# Patient Record
Sex: Male | Born: 1970 | Race: Black or African American | Hispanic: No | Marital: Married | Smoking: Never smoker
Health system: Southern US, Community
[De-identification: ages and names within clinical notes are randomized; demographics above are authoritative.]

## PROBLEM LIST (undated history)

## (undated) DIAGNOSIS — E049 Nontoxic goiter, unspecified: Secondary | ICD-10-CM

## (undated) DIAGNOSIS — M25562 Pain in left knee: Secondary | ICD-10-CM

## (undated) DIAGNOSIS — R7309 Other abnormal glucose: Secondary | ICD-10-CM

## (undated) DIAGNOSIS — E785 Hyperlipidemia, unspecified: Secondary | ICD-10-CM

## (undated) DIAGNOSIS — E119 Type 2 diabetes mellitus without complications: Secondary | ICD-10-CM

## (undated) DIAGNOSIS — M542 Cervicalgia: Secondary | ICD-10-CM

## (undated) DIAGNOSIS — K648 Other hemorrhoids: Secondary | ICD-10-CM

## (undated) DIAGNOSIS — E8881 Metabolic syndrome: Secondary | ICD-10-CM

## (undated) DIAGNOSIS — R6882 Decreased libido: Secondary | ICD-10-CM

## (undated) DIAGNOSIS — M1990 Unspecified osteoarthritis, unspecified site: Secondary | ICD-10-CM

## (undated) DIAGNOSIS — E669 Obesity, unspecified: Secondary | ICD-10-CM

## (undated) DIAGNOSIS — N529 Male erectile dysfunction, unspecified: Secondary | ICD-10-CM

## (undated) DIAGNOSIS — E291 Testicular hypofunction: Secondary | ICD-10-CM

## (undated) DIAGNOSIS — K219 Gastro-esophageal reflux disease without esophagitis: Secondary | ICD-10-CM

## (undated) HISTORY — DX: Hyperlipidemia, unspecified: E78.5

## (undated) HISTORY — DX: Cervicalgia: M54.2

## (undated) HISTORY — DX: Decreased libido: R68.82

## (undated) HISTORY — DX: Gastro-esophageal reflux disease without esophagitis: K21.9

## (undated) HISTORY — PX: FRACTURE SURGERY: SHX138

## (undated) HISTORY — DX: Male erectile dysfunction, unspecified: N52.9

## (undated) HISTORY — DX: Type 2 diabetes mellitus without complications: E11.9

## (undated) HISTORY — DX: Metabolic syndrome: E88.810

## (undated) HISTORY — DX: Nontoxic goiter, unspecified: E04.9

## (undated) HISTORY — DX: Obesity, unspecified: E66.9

## (undated) HISTORY — PX: ACHILLES TENDON SURGERY: SHX542

## (undated) HISTORY — DX: Unspecified osteoarthritis, unspecified site: M19.90

## (undated) HISTORY — DX: Other hemorrhoids: K64.8

## (undated) HISTORY — DX: Metabolic syndrome: E88.81

## (undated) HISTORY — DX: Pain in left knee: M25.562

## (undated) HISTORY — DX: Testicular hypofunction: E29.1

## (undated) HISTORY — DX: Other abnormal glucose: R73.09

## (undated) HISTORY — PX: COLONOSCOPY: SHX174

---

## 2006-11-17 ENCOUNTER — Ambulatory Visit: Payer: Self-pay | Admitting: Family Medicine

## 2006-11-17 DIAGNOSIS — M542 Cervicalgia: Secondary | ICD-10-CM | POA: Insufficient documentation

## 2008-05-31 ENCOUNTER — Ambulatory Visit: Payer: Self-pay | Admitting: Orthopedic Surgery

## 2008-06-03 ENCOUNTER — Ambulatory Visit: Payer: Self-pay | Admitting: Orthopedic Surgery

## 2008-08-19 ENCOUNTER — Encounter: Admission: RE | Admit: 2008-08-19 | Discharge: 2008-09-10 | Payer: Self-pay | Admitting: Orthopedic Surgery

## 2008-11-18 IMAGING — CR CERVICAL SPINE - COMPLETE 4+ VIEW
1 series · 5 of 5 positions shown · non-contrast
Comparison: none

REASON FOR EXAM: cervical pain
COMMENTS:

[Series 1: view not recorded · 0.17mm/px · 5 of 5 slices shown]
[im 1/5]
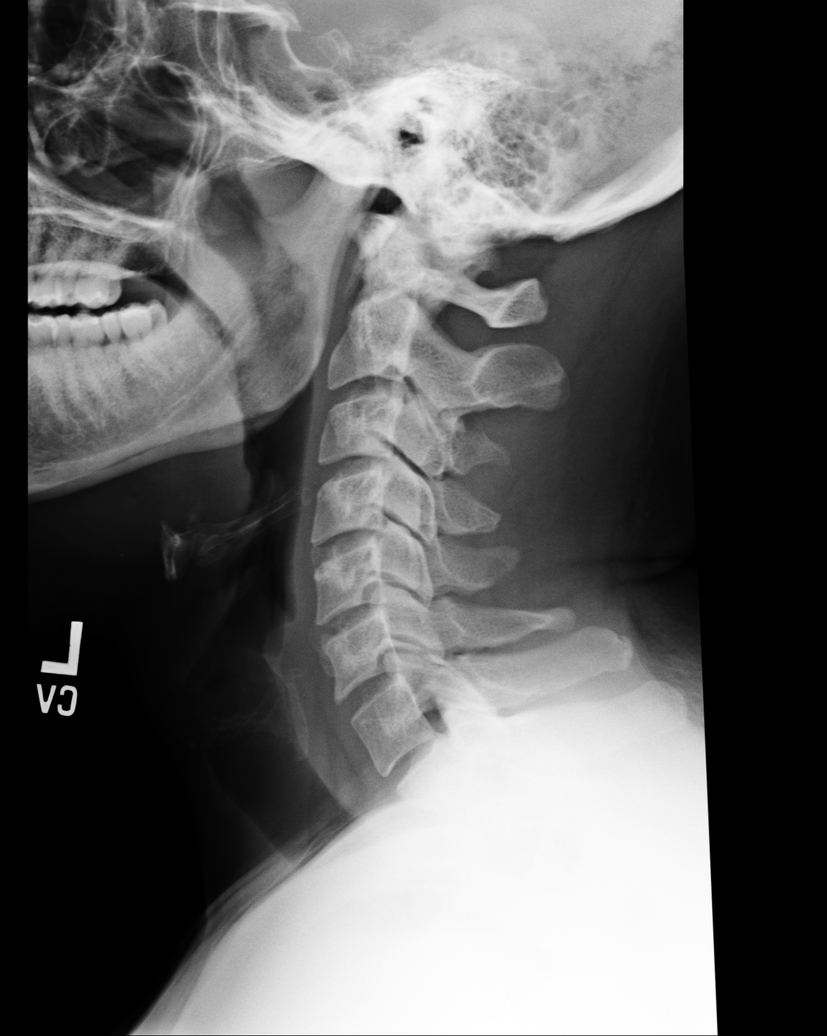
[im 2/5]
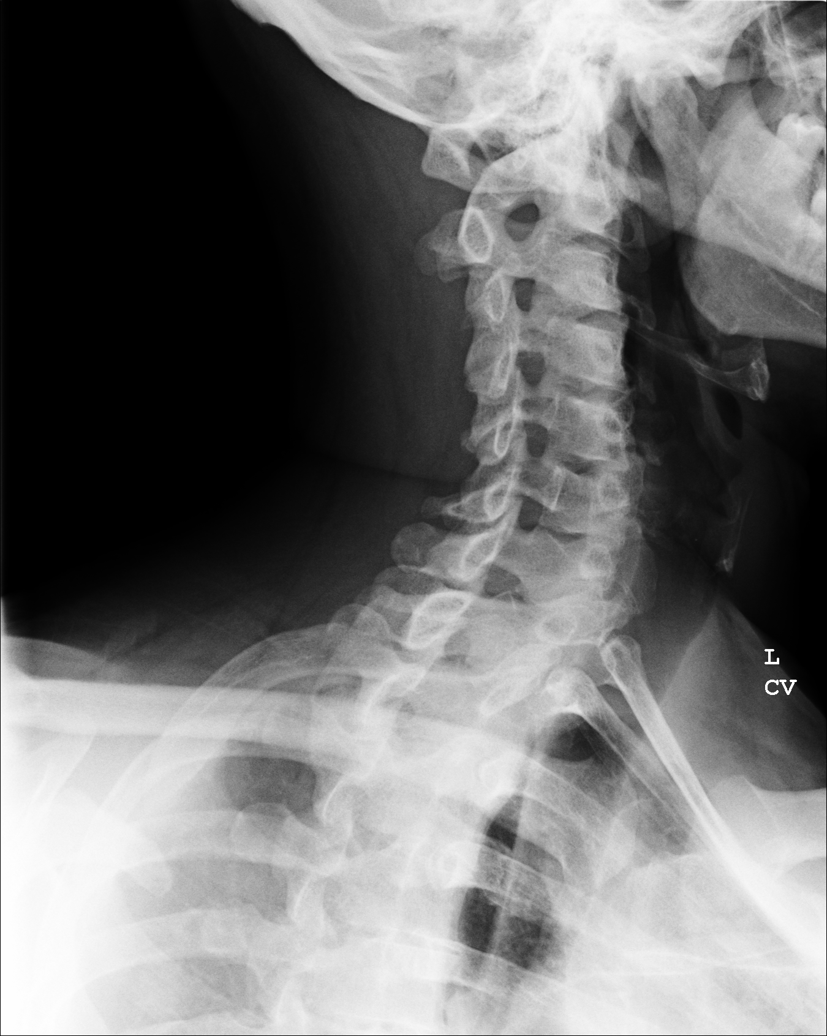
[im 3/5]
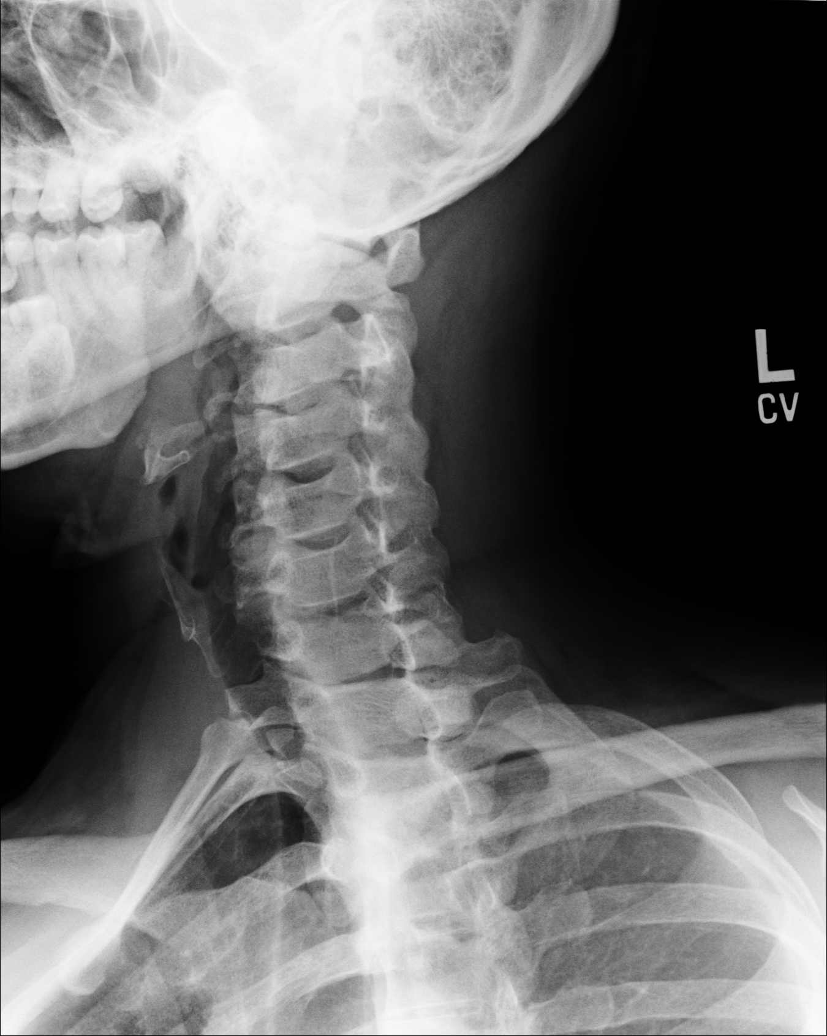
[im 4/5]
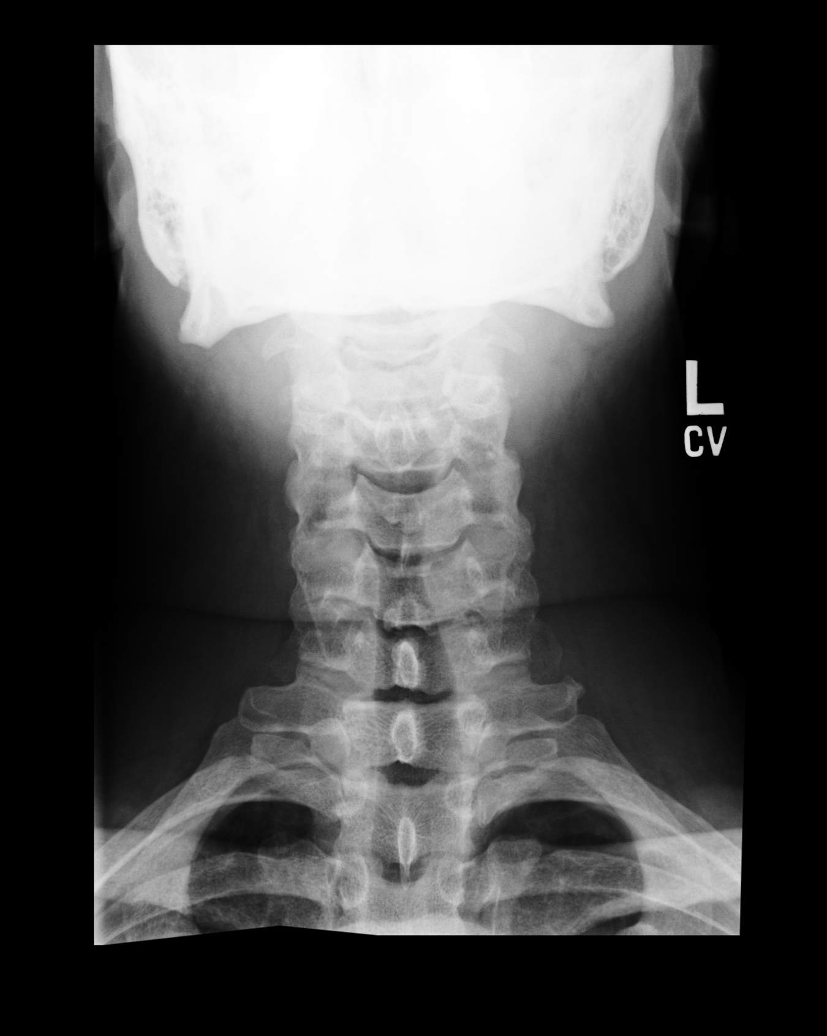
[im 5/5]
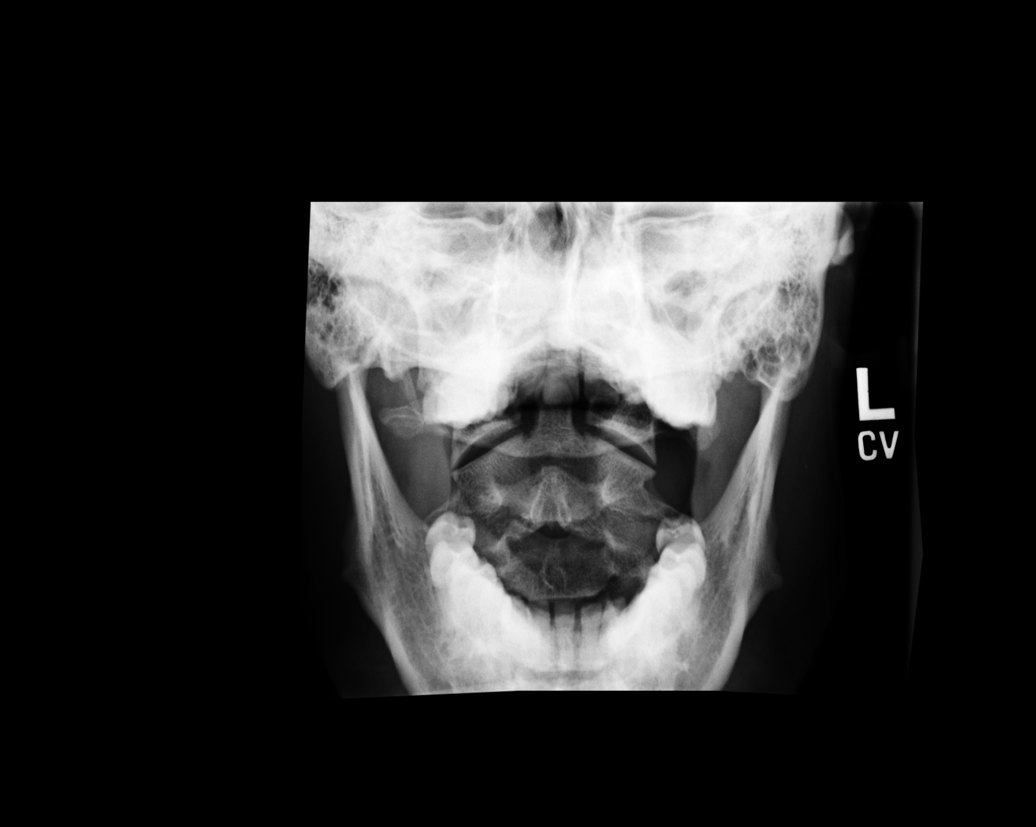

[5 of 5 positions shown; findings below may reference images not displayed]

PROCEDURE:     DXR - DXR CERVICAL SPINE COMPLETE  - November 17, 2006 [DATE]

RESULT:     The vertebral body heights and the intervertebral disc spaces
are well maintained. The vertebral body alignment is normal. Oblique view
shows the neural foramina to be widely patent bilaterally. The odontoid
process is intact.
IMPRESSION: No significant abnormalities are noted.

## 2008-11-25 DIAGNOSIS — R7309 Other abnormal glucose: Secondary | ICD-10-CM | POA: Insufficient documentation

## 2009-06-12 ENCOUNTER — Emergency Department: Payer: Self-pay | Admitting: Unknown Physician Specialty

## 2010-01-14 ENCOUNTER — Ambulatory Visit: Payer: Self-pay | Admitting: Unknown Physician Specialty

## 2010-01-15 LAB — PATHOLOGY REPORT

## 2014-02-21 ENCOUNTER — Ambulatory Visit: Payer: Self-pay | Admitting: Family Medicine

## 2014-02-21 LAB — PSA: PSA: NORMAL

## 2014-02-21 LAB — HEMOGLOBIN A1C: Hgb A1c MFr Bld: 6.6 % — AB (ref 4.0–6.0)

## 2014-02-21 LAB — LIPID PANEL
CHOLESTEROL: 154 mg/dL (ref 0–200)
HDL: 31 mg/dL — AB (ref 35–70)
LDL Cholesterol: 107 mg/dL
TRIGLYCERIDES: 78 mg/dL (ref 40–160)

## 2014-03-11 ENCOUNTER — Ambulatory Visit: Payer: Self-pay | Admitting: Gastroenterology

## 2014-03-11 LAB — HM COLONOSCOPY

## 2014-09-24 ENCOUNTER — Encounter: Payer: Self-pay | Admitting: Family Medicine

## 2014-09-24 DIAGNOSIS — M1712 Unilateral primary osteoarthritis, left knee: Secondary | ICD-10-CM | POA: Insufficient documentation

## 2014-09-24 DIAGNOSIS — E8881 Metabolic syndrome: Secondary | ICD-10-CM | POA: Insufficient documentation

## 2014-09-24 DIAGNOSIS — R6882 Decreased libido: Secondary | ICD-10-CM | POA: Insufficient documentation

## 2014-09-24 DIAGNOSIS — N529 Male erectile dysfunction, unspecified: Secondary | ICD-10-CM | POA: Insufficient documentation

## 2014-09-24 DIAGNOSIS — E785 Hyperlipidemia, unspecified: Secondary | ICD-10-CM | POA: Insufficient documentation

## 2014-09-24 DIAGNOSIS — E049 Nontoxic goiter, unspecified: Secondary | ICD-10-CM | POA: Insufficient documentation

## 2014-09-24 DIAGNOSIS — K648 Other hemorrhoids: Secondary | ICD-10-CM | POA: Insufficient documentation

## 2014-09-24 DIAGNOSIS — E669 Obesity, unspecified: Secondary | ICD-10-CM | POA: Insufficient documentation

## 2014-09-27 ENCOUNTER — Encounter: Payer: Self-pay | Admitting: Family Medicine

## 2014-09-27 ENCOUNTER — Ambulatory Visit (INDEPENDENT_AMBULATORY_CARE_PROVIDER_SITE_OTHER): Payer: BLUE CROSS/BLUE SHIELD | Admitting: Family Medicine

## 2014-09-27 VITALS — BP 122/82 | HR 77 | Temp 97.8°F | Resp 16 | Ht 74.0 in | Wt 225.3 lb

## 2014-09-27 DIAGNOSIS — M5412 Radiculopathy, cervical region: Secondary | ICD-10-CM | POA: Diagnosis not present

## 2014-09-27 DIAGNOSIS — E8881 Metabolic syndrome: Secondary | ICD-10-CM | POA: Diagnosis not present

## 2014-09-27 DIAGNOSIS — E049 Nontoxic goiter, unspecified: Secondary | ICD-10-CM | POA: Diagnosis not present

## 2014-09-27 DIAGNOSIS — M25561 Pain in right knee: Secondary | ICD-10-CM | POA: Diagnosis not present

## 2014-09-27 DIAGNOSIS — R7309 Other abnormal glucose: Secondary | ICD-10-CM

## 2014-09-27 DIAGNOSIS — E785 Hyperlipidemia, unspecified: Secondary | ICD-10-CM

## 2014-09-27 DIAGNOSIS — Z23 Encounter for immunization: Secondary | ICD-10-CM

## 2014-09-27 DIAGNOSIS — E663 Overweight: Secondary | ICD-10-CM | POA: Diagnosis not present

## 2014-09-27 DIAGNOSIS — M25562 Pain in left knee: Secondary | ICD-10-CM | POA: Insufficient documentation

## 2014-09-27 NOTE — Patient Instructions (Signed)
Lipid panel shows low HDL : to improve HDL patient  needs to eat tree nuts ( pecans/pistachios/almonds ) four times weekly, eat fish two times weekly  and exercise  at least 150 minutes per week 

## 2014-09-27 NOTE — Progress Notes (Signed)
Name: Jeffrey Schneider   MRN: [id number]    DOB: 1970-03-15   Date:09/27/2014       Progress Note  Subjective  Chief Complaint  Chief Complaint  Patient presents with  . Follow-up    6 month  . Numbness    bilateral hand and pain    HPI  Metabolic Syndrome: in Feb 2016 his hgbA1C was 6.6, but he has changed his diet and has lost a lot of weight, his last hgbA1C done at work on Jun 08 2014 and it was down 6.1%. He is doing excellent at this time.   Goiter: slightly enlarged on [place] but no cysts.   Cervical pain with intermittent radiculitis: he states he has intermittent and occasionally he has numbness and sometimes right shoulder pain.  Symptoms lasts for hours and resolves by itself or with Naproxen prn . He is no sure what part of his hand is numb.   Overweight: his weight has gone down from 281.19lbs in January 2016 to 225.3 lbs. He is doing excellent with life style modifications, he has been exercising between 5-6 days weekly, avoiding sodas, drinking water and is feeling well  Dyslipidemia; his HDL was low, but now he is exercising , eats fish a couple of times weekly and would like to have labs rechecked.   Patient Active Problem List   Diagnosis Date Noted  . Bilateral knee pain 09/27/2014  . Dyslipidemia 09/24/2014  . Decreased libido 09/24/2014  . Failure of erection 09/24/2014  . Goiter 09/24/2014  . Hemorrhoids, internal 09/24/2014  . Dysmetabolic syndrome 09/24/2014  . Overweight (BMI 25.0-29.9) 09/24/2014  . Osteoarthritis of left knee 09/24/2014  . Abnormal blood sugar 11/25/2008  . Cervical pain 11/17/2006    Past Surgical History  Procedure Laterality Date  . Achilles tendon surgery Left     Past Rupture    Family History  Problem Relation Age of Onset  . Depression Mother     Social History   Social History  . Marital Status: Married    Spouse Name: N/A  . Number of Children: N/A  . Years of Education: N/A   Occupational History  . Not on  file.   Social History Main Topics  . Smoking status: Never Smoker   . Smokeless tobacco: Never Used  . Alcohol Use: 0.0 oz/week    0 Standard drinks or equivalent per week     Comment: socially  . Drug Use: No  . Sexual Activity: Yes   Other Topics Concern  . Not on file   Social History Narrative    No current outpatient prescriptions on file.  No Known Allergies   ROS  Constitutional: Negative for fever, positive for  weight change.  Respiratory: Negative for cough and shortness of breath.   Cardiovascular: Negative for chest pain or palpitations.  Gastrointestinal: Negative for abdominal pain, no bowel changes.  Musculoskeletal: Negative for gait problem or joint swelling. He has grinding and intermittent knee pain  Skin: Negative for rash.  Neurological: Negative for dizziness or headache.  No other specific complaints in a complete review of systems (except as listed in HPI above).  Objective  Filed Vitals:   09/27/14 0827  BP: 122/82  Pulse: 77  Temp: 97.8 F (36.6 C)  TempSrc: Oral  Resp: 16  Height: 6\' 2"  (1.88 m)  Weight: 225 lb 4.8 oz (102.195 kg)  SpO2: 96%    Body mass index is 28.91 kg/(m^2).  Physical Exam  Constitutional: Patient appears  well-developed and well-nourished. Overweight  No distress.  HEENT: head atraumatic, normocephalic, pupils equal and reactive to light,neck supple, throat within normal limits Cardiovascular: Normal rate, regular rhythm and normal heart sounds.  No murmur heard. No BLE edema. Pulmonary/Chest: Effort normal and breath sounds normal. No respiratory distress. Abdominal: Soft.  There is no tenderness. Psychiatric: Patient has a normal mood and affect. behavior is normal. Judgment and thought content normal. Muscular Skeletal: grinding with extension of both knees, normal knee exam, normal shoulder exam Neuro: normal grip, normal sensation upper and lower extremity   PHQ2/9: Depression screen PHQ 2/9 09/27/2014   Decreased Interest 0  Down, Depressed, Hopeless 0  PHQ - 2 Score 0     Fall Risk: Fall Risk  09/27/2014  Falls in the past year? Yes  Number falls in past yr: 2 or more  Injury with Fall? No     Functional Status Survey: Is the patient deaf or have difficulty hearing?: No Does the patient have difficulty seeing, even when wearing glasses/contacts?: Yes (contacts/glasses) Does the patient have difficulty concentrating, remembering, or making decisions?: No Does the patient have difficulty walking or climbing stairs?: No Does the patient have difficulty dressing or bathing?: No Does the patient have difficulty doing errands alone such as visiting a doctor's office or shopping?: No    Assessment & Plan  1. Dysmetabolic syndrome Continue life style modification  - Hemoglobin A1c - Comprehensive metabolic panel  2. Needs flu shot  - Flu Vaccine QUAD 36+ mos PF IM (Fluarix & Fluzone Quad PF)- refused, he will get it at work   3. Dyslipidemia Lipid panel shows low HDL : to improve HDL patient  needs to eat tree nuts ( pecans/pistachios/almonds ) four times weekly, eat fish two times weekly  and exercise  at least 150 minutes per week  - Lipid panel  4. Abnormal blood sugar Doing well with life style modification   5. Goiter US done in [place], does not need biopsy  6. Overweight (BMI 25.0-29.9) Discussed with the patient the risk posed by an increased BMI. Discussed importance of portion control, calorie counting and at least 150 minutes of physical activity weekly. Avoid sweet beverages and drink more water. Eat at least 6 servings of fruit and vegetables daily   7. Bilateral knee pain Likely OA, advised Tylenol 500 mg three times daily prn    8. Cervical radiculitis Not at this time, he does not want any further testing at this time, advised to write down exactly where he gets numb and how long it lasts

## 2014-11-01 LAB — LIPID PANEL
Cholesterol: 159 mg/dL (ref 0–200)
HDL: 45 mg/dL (ref 35–70)
LDL Cholesterol: 97 mg/dL
TRIGLYCERIDES: 85 mg/dL (ref 40–160)

## 2014-11-01 LAB — HEMOGLOBIN A1C: HEMOGLOBIN A1C: 5.8 % (ref 4.0–6.0)

## 2015-02-18 ENCOUNTER — Ambulatory Visit (INDEPENDENT_AMBULATORY_CARE_PROVIDER_SITE_OTHER): Payer: BLUE CROSS/BLUE SHIELD | Admitting: Family Medicine

## 2015-02-18 ENCOUNTER — Encounter: Payer: Self-pay | Admitting: Family Medicine

## 2015-02-18 VITALS — BP 116/68 | HR 71 | Temp 98.3°F | Resp 16 | Ht 74.0 in | Wt 232.9 lb

## 2015-02-18 DIAGNOSIS — Z0000 Encounter for general adult medical examination without abnormal findings: Secondary | ICD-10-CM | POA: Diagnosis not present

## 2015-02-18 DIAGNOSIS — I83811 Varicose veins of right lower extremities with pain: Secondary | ICD-10-CM

## 2015-02-18 DIAGNOSIS — E8881 Metabolic syndrome: Secondary | ICD-10-CM | POA: Diagnosis not present

## 2015-02-18 DIAGNOSIS — E049 Nontoxic goiter, unspecified: Secondary | ICD-10-CM | POA: Diagnosis not present

## 2015-02-18 MED ORDER — MEDICAL COMPRESSION STOCKINGS MISC: 2.0000 [IU] | Freq: Every day | Status: DC

## 2015-02-18 MED ORDER — THERA VITAL M PO TABS: 1.0000 | ORAL_TABLET | Freq: Every day | ORAL | Status: DC

## 2015-02-18 NOTE — Progress Notes (Signed)
Name: Jeffrey Schneider   MRN: [id number]    DOB: April 01, 1970   Date:02/18/2015       Progress Note  Subjective  Chief Complaint  Chief Complaint  Patient presents with  . Annual Exam    HPI  Well male exam: he states his libido has improved, he has noticed that erection is not as hard as when he was 20 , but they still last long enough. He states not bad enough to need therapy at this time.   Goiter: had thyroid US in the past, we will check Thyroid function, he had some weight gain because he has changed his diet, not eating as many salads.   Varicose Veins: he has noticed some bulging on the right lower leg, also has pain when he stands or walks. Pain is described as soreness, and after activity it is worse, and when standing. He plays basketball twice weekly and makes his legs very sore, but right is worse than left.   Metabolic Syndrome: HDL has improved, triglycerides within normal limits, hgbA1C has improved, but he is not following a diet as much now. He denies polyphagia, polyuria or polydipsia at this time  Patient Active Problem List   Diagnosis Date Noted  . Bilateral knee pain 09/27/2014  . Dyslipidemia 09/24/2014  . Decreased libido 09/24/2014  . Failure of erection 09/24/2014  . Goiter 09/24/2014  . Hemorrhoids, internal 09/24/2014  . Dysmetabolic syndrome 09/24/2014  . Overweight (BMI 25.0-29.9) 09/24/2014  . Osteoarthritis of left knee 09/24/2014  . Abnormal blood sugar 11/25/2008  . Cervical pain 11/17/2006    Past Surgical History  Procedure Laterality Date  . Achilles tendon surgery Left     Past Rupture    Family History  Problem Relation Age of Onset  . Depression Mother   . Obesity Mother   . Alcohol abuse Father   . Cirrhosis Father   . Cirrhosis Sister   . Alcohol abuse Brother   . Allergic rhinitis Daughter   . Allergic rhinitis Son   . Cancer Maternal Grandmother     Colon    Social History   Social History  . Marital Status:  Married    Spouse Name: N/A  . Number of Children: N/A  . Years of Education: N/A   Occupational History  . Not on file.   Social History Main Topics  . Smoking status: Never Smoker   . Smokeless tobacco: Never Used  . Alcohol Use: No  . Drug Use: No  . Sexual Activity:    Partners: Female   Other Topics Concern  . Not on file   Social History Narrative     Current outpatient prescriptions:  Marland Kitchen  Multiple Vitamins-Minerals (MULTIVITAMIN) tablet, Take 1 tablet by mouth daily., Disp: 30 tablet, Rfl: 0  No Known Allergies   ROS  Constitutional: Negative for fever or weight change.  Respiratory: Negative for cough and shortness of breath.   Cardiovascular: Negative for chest pain or palpitations.  Gastrointestinal: Negative for abdominal pain, no bowel changes.  Musculoskeletal: Negative for gait problem or joint swelling.  Skin: Negative for rash.  Neurological: Negative for dizziness or headache.  No other specific complaints in a complete review of systems (except as listed in HPI above).  Objective  Filed Vitals:   02/18/15 0844  BP: 116/68  Pulse: 71  Temp: 98.3 F (36.8 C)  TempSrc: Oral  Resp: 16  Height:  (1.88 m)  Weight: 232 lb 14.4 oz (105.643 kg)  SpO2: 97%    Body mass index is 29.89 kg/(m^2).  Physical Exam  Constitutional: Patient appears well-developed and well-nourished. No distress.  HENT: Head: Normocephalic and atraumatic. Ears: B TMs ok, no erythema or effusion; Nose: Nose normal. Mouth/Throat: Oropharynx is clear and moist. No oropharyngeal exudate.  Eyes: Conjunctivae and EOM are normal. Pupils are equal, round, and reactive to light. No scleral icterus.  Neck: Normal range of motion. Neck supple. No JVD present. Tyromegaly present.  Cardiovascular: Normal rate, regular rhythm and normal heart sounds.  No murmur heard. No BLE edema. Varicose veins on right lower extremity Pulmonary/Chest: Effort normal and breath sounds normal. No  respiratory distress. Abdominal: Soft. Bowel sounds are normal, no distension. There is no tenderness. no masses MALE GENITALIA: Normal descended testes bilaterally, no masses palpated, no hernias, no lesions, no discharge RECTAL: not done - discussed USPTF and he chose not to have it done Musculoskeletal: Normal range of motion, no joint effusions. No gross deformities Neurological: he is alert and oriented to person, place, and time. No cranial nerve deficit. Coordination, balance, strength, speech and gait are normal.  Skin: Skin is warm and dry. No rash noted. No erythema.  Psychiatric: Patient has a normal mood and affect. behavior is normal. Judgment and thought content normal.  PHQ2/9: Depression screen [facility] 2/9 02/18/2015 09/27/2014  Decreased Interest 0 0  Down, Depressed, Hopeless 0 0  PHQ - 2 Score 0 0    Fall Risk: Fall Risk  02/18/2015 09/27/2014  Falls in the past year? No Yes  Number falls in past yr: - 2 or more  Injury with Fall? - No     Functional Status Survey: Is the patient deaf or have difficulty hearing?: No Does the patient have difficulty seeing, even when wearing glasses/contacts?: No Does the patient have difficulty concentrating, remembering, or making decisions?: No Does the patient have difficulty walking or climbing stairs?: No Does the patient have difficulty dressing or bathing?: No Does the patient have difficulty doing errands alone such as visiting a doctor's office or shopping?: No    Assessment & Plan  1. Encounter for routine history and physical exam for male  Discussed importance of 150 minutes of physical activity weekly, eat two servings of fish weekly, eat one serving of tree nuts ( cashews, pistachios, pecans, almonds.Marland Kitchen) every other day, eat 6 servings of fruit/vegetables daily and drink plenty of water and avoid sweet beverages.   2. Goiter  [place] mild enlargement  - Thyroid Panel With TSH  3. Varicose veins of leg with pain,  right  - Elastic Bandages & Supports (MEDICAL COMPRESSION STOCKINGS) MISC; 2 Units by Does not apply route daily.  Dispense: 2 each; Refill: 2  4. Dysmetabolic syndrome  Discussed diet and exercise and importance of losing weight again

## 2015-02-20 ENCOUNTER — Encounter: Payer: Self-pay | Admitting: Family Medicine

## 2015-08-18 ENCOUNTER — Ambulatory Visit: Payer: BLUE CROSS/BLUE SHIELD | Admitting: Family Medicine

## 2015-09-02 ENCOUNTER — Encounter: Payer: Self-pay | Admitting: Family Medicine

## 2015-09-02 ENCOUNTER — Ambulatory Visit (INDEPENDENT_AMBULATORY_CARE_PROVIDER_SITE_OTHER): Payer: BLUE CROSS/BLUE SHIELD | Admitting: Family Medicine

## 2015-09-02 VITALS — BP 130/80 | HR 68 | Temp 98.2°F | Resp 18 | Ht 74.0 in | Wt 234.4 lb

## 2015-09-02 DIAGNOSIS — E049 Nontoxic goiter, unspecified: Secondary | ICD-10-CM | POA: Diagnosis not present

## 2015-09-02 DIAGNOSIS — E785 Hyperlipidemia, unspecified: Secondary | ICD-10-CM | POA: Diagnosis not present

## 2015-09-02 DIAGNOSIS — E663 Overweight: Secondary | ICD-10-CM

## 2015-09-02 DIAGNOSIS — E8881 Metabolic syndrome: Secondary | ICD-10-CM

## 2015-09-02 DIAGNOSIS — I83811 Varicose veins of right lower extremities with pain: Secondary | ICD-10-CM

## 2015-09-02 LAB — COMPLETE METABOLIC PANEL WITH GFR
ALBUMIN: 4.6 g/dL (ref 3.6–5.1)
ALK PHOS: 68 U/L (ref 40–115)
ALT: 16 U/L (ref 9–46)
AST: 17 U/L (ref 10–40)
BILIRUBIN TOTAL: 0.7 mg/dL (ref 0.2–1.2)
BUN: 15 mg/dL (ref 7–25)
CO2: 26 mmol/L (ref 20–31)
CREATININE: 1.28 mg/dL (ref 0.60–1.35)
Calcium: 9.7 mg/dL (ref 8.6–10.3)
Chloride: 105 mmol/L (ref 98–110)
GFR, EST AFRICAN AMERICAN: 79 mL/min (ref >=60)
GFR, EST NON AFRICAN AMERICAN: 69 mL/min (ref >=60)
GLUCOSE: 97 mg/dL (ref 65–99)
Potassium: 4.9 mmol/L (ref 3.5–5.3)
SODIUM: 141 mmol/L (ref 135–146)
TOTAL PROTEIN: 7.2 g/dL (ref 6.1–8.1)

## 2015-09-02 LAB — LIPID PANEL
Cholesterol: 145 mg/dL (ref 125–200)
HDL: 44 mg/dL (ref >=40)
LDL Cholesterol: 86 mg/dL (ref <130)
TRIGLYCERIDES: 75 mg/dL (ref <150)
Total CHOL/HDL Ratio: 3.3 Ratio (ref <=5.0)
VLDL: 15 mg/dL (ref <30)

## 2015-09-02 NOTE — Progress Notes (Signed)
Name: Jeffrey Schneider   MRN: [id number]    DOB: Jan 09, 1971   Date:09/02/2015       Progress Note  Subjective  Chief Complaint  Chief Complaint  Patient presents with  . Obesity    pt here for 6 month follow up  . Hyperlipidemia    HPI  Goiter: had thyroid US 2017 we will recheck thyroid function. He denies fatigue  Varicose Veins: he has noticed some bulging on the right lower leg, also has pain when he stands or walks. Pain is described as soreness, and after activity it is worse, and when standing. He plays basketball twice weekly he also works out at [employer] 4 days a week, exercise makes his legs very sore, but right is worse than left. He was given prescription compression stocking hoses but he stopped using it. He is not sure why.   Metabolic Syndrome: HDL has improved, triglycerides within normal limits, we will recheck hgbA1C, today. He denies polyphagia, polyuria or polydipsia at this time     Patient Active Problem List   Diagnosis Date Noted  . Bilateral knee pain 09/27/2014  . Dyslipidemia 09/24/2014  . Decreased libido 09/24/2014  . Failure of erection 09/24/2014  . Goiter 09/24/2014  . Hemorrhoids, internal 09/24/2014  . Dysmetabolic syndrome 09/24/2014  . Overweight (BMI 25.0-29.9) 09/24/2014  . Osteoarthritis of left knee 09/24/2014  . Abnormal blood sugar 11/25/2008  . Cervical pain 11/17/2006    Past Surgical History:  Procedure Laterality Date  . ACHILLES TENDON SURGERY Left    Past Rupture    Family History  Problem Relation Age of Onset  . Depression Mother   . Obesity Mother   . Alcohol abuse Father   . Cirrhosis Father   . Cirrhosis Sister   . Alcohol abuse Brother   . Allergic rhinitis Daughter   . Allergic rhinitis Son   . Cancer Maternal Grandmother     Colon    Social History   Social History  . Marital status: Married    Spouse name: N/A  . Number of children: N/A  . Years of education: N/A   Occupational History  .  Not on file.   Social History Main Topics  . Smoking status: Never Smoker  . Smokeless tobacco: Never Used  . Alcohol use No  . Drug use: No  . Sexual activity: Yes    Partners: Female   Other Topics Concern  . Not on file   Social History Narrative  . No narrative on file     Current Outpatient Prescriptions:  Marland Kitchen.  Multiple Vitamins-Minerals (MULTIVITAMIN) tablet, Take 1 tablet by mouth daily. (Patient not taking: Reported on 09/02/2015), Disp: 30 tablet, Rfl: 0  No Known Allergies   ROS  Constitutional: Negative for fever or weight change.  Respiratory: Negative for cough and shortness of breath.   Cardiovascular: Negative for chest pain or palpitations.  Gastrointestinal: Negative for abdominal pain, no bowel changes.  Musculoskeletal: Negative for gait problem or joint swelling.  Skin: Negative for rash.  Neurological: Negative for dizziness or headache.  No other specific complaints in a complete review of systems (except as listed in HPI above).  Objective  Vitals:   09/02/15 0832  BP: 130/80  Pulse: 68  Resp: 18  Temp: 98.2 F (36.8 C)  SpO2: 95%  Weight: 234 lb 7 oz (106.3 kg)  Height: 6\' 2"  (1.88 m)    Body mass index is 30.1 kg/m.  Physical Exam  Constitutional: Patient  appears well-developed and well-nourished. Overweight.  No distress.  HEENT: head atraumatic, normocephalic, pupils equal and reactive to light,  neck supple, throat within normal limits Cardiovascular: Normal rate, regular rhythm and normal heart sounds.  No murmur heard. No BLE edema. Varicose veins on right leg Pulmonary/Chest: Effort normal and breath sounds normal. No respiratory distress. Abdominal: Soft.  There is no tenderness. Psychiatric: Patient has a normal mood and affect. behavior is normal. Judgment and thought content normal.   PHQ2/9: [facility] 2/9 09/02/2015 02/18/2015 09/27/2014  Decreased Interest 0 0 0  Down, Depressed, Hopeless 0 0 0  PHQ - 2 Score  0 0 0     Fall Risk: Fall Risk  09/02/2015 02/18/2015 09/27/2014  Falls in the past year? No No Yes  Number falls in past yr: - - 2 or more  Injury with Fall? - - No     Functional Status Survey: Is the patient deaf or have difficulty hearing?: No Does the patient have difficulty seeing, even when wearing glasses/contacts?: No Does the patient have difficulty concentrating, remembering, or making decisions?: No Does the patient have difficulty walking or climbing stairs?: Yes Does the patient have difficulty dressing or bathing?: No Does the patient have difficulty doing errands alone such as visiting a doctor's office or shopping?: No    Assessment & Plan  1. Dyslipidemia  - Lipid panel  2. Dysmetabolic syndrome  - COMPLETE METABOLIC PANEL WITH GFR - Hemoglobin A1c  3. Overweight (BMI 25.0-29.9)  Discussed with the patient the risk posed by an increased BMI. Discussed importance of portion control, calorie counting and at least 150 minutes of physical activity weekly. Avoid sweet beverages and drink more water. Eat at least 6 servings of fruit and vegetables daily   4. Varicose veins of leg with pain, right  He seems to be doing better  5. Goiter  - Thyroid Panel With TSH

## 2015-09-03 LAB — THYROID PANEL WITH TSH
Free Thyroxine Index: 2.4 (ref 1.4–3.8)
T3 UPTAKE: 38 % — AB (ref 22–35)
T4, Total: 6.3 ug/dL (ref 4.5–12.0)
TSH: 1.39 m[IU]/L (ref 0.40–4.50)

## 2015-09-03 LAB — HEMOGLOBIN A1C
Hgb A1c MFr Bld: 5.3 % (ref <5.7)
Mean Plasma Glucose: 105 mg/dL

## 2015-12-25 DIAGNOSIS — M25562 Pain in left knee: Secondary | ICD-10-CM | POA: Diagnosis not present

## 2016-01-06 DIAGNOSIS — M25562 Pain in left knee: Secondary | ICD-10-CM | POA: Diagnosis not present

## 2016-01-21 DIAGNOSIS — M1712 Unilateral primary osteoarthritis, left knee: Secondary | ICD-10-CM | POA: Diagnosis not present

## 2016-01-30 DIAGNOSIS — S83289A Other tear of lateral meniscus, current injury, unspecified knee, initial encounter: Secondary | ICD-10-CM | POA: Insufficient documentation

## 2016-02-09 DIAGNOSIS — M25562 Pain in left knee: Secondary | ICD-10-CM | POA: Diagnosis not present

## 2016-02-09 DIAGNOSIS — R609 Edema, unspecified: Secondary | ICD-10-CM | POA: Diagnosis not present

## 2016-02-09 DIAGNOSIS — M25662 Stiffness of left knee, not elsewhere classified: Secondary | ICD-10-CM | POA: Diagnosis not present

## 2016-02-11 DIAGNOSIS — M25562 Pain in left knee: Secondary | ICD-10-CM | POA: Diagnosis not present

## 2016-02-11 DIAGNOSIS — R609 Edema, unspecified: Secondary | ICD-10-CM | POA: Diagnosis not present

## 2016-02-11 DIAGNOSIS — M25662 Stiffness of left knee, not elsewhere classified: Secondary | ICD-10-CM | POA: Diagnosis not present

## 2016-02-17 DIAGNOSIS — M25562 Pain in left knee: Secondary | ICD-10-CM | POA: Diagnosis not present

## 2016-02-17 DIAGNOSIS — M25662 Stiffness of left knee, not elsewhere classified: Secondary | ICD-10-CM | POA: Diagnosis not present

## 2016-02-17 DIAGNOSIS — R600 Localized edema: Secondary | ICD-10-CM | POA: Diagnosis not present

## 2016-02-19 DIAGNOSIS — M25562 Pain in left knee: Secondary | ICD-10-CM | POA: Diagnosis not present

## 2016-02-19 DIAGNOSIS — R609 Edema, unspecified: Secondary | ICD-10-CM | POA: Diagnosis not present

## 2016-02-19 DIAGNOSIS — M25662 Stiffness of left knee, not elsewhere classified: Secondary | ICD-10-CM | POA: Diagnosis not present

## 2016-02-25 ENCOUNTER — Encounter: Payer: BLUE CROSS/BLUE SHIELD | Admitting: Family Medicine

## 2016-02-26 DIAGNOSIS — M1712 Unilateral primary osteoarthritis, left knee: Secondary | ICD-10-CM | POA: Diagnosis not present

## 2016-02-26 DIAGNOSIS — M25562 Pain in left knee: Secondary | ICD-10-CM | POA: Diagnosis not present

## 2016-04-29 ENCOUNTER — Encounter: Payer: Self-pay | Admitting: Family Medicine

## 2016-04-29 ENCOUNTER — Ambulatory Visit (INDEPENDENT_AMBULATORY_CARE_PROVIDER_SITE_OTHER): Payer: BLUE CROSS/BLUE SHIELD | Admitting: Family Medicine

## 2016-04-29 VITALS — BP 128/84 | HR 60 | Temp 98.2°F | Resp 16 | Ht 74.0 in | Wt 242.2 lb

## 2016-04-29 DIAGNOSIS — E669 Obesity, unspecified: Secondary | ICD-10-CM

## 2016-04-29 DIAGNOSIS — Z0000 Encounter for general adult medical examination without abnormal findings: Secondary | ICD-10-CM | POA: Diagnosis not present

## 2016-04-29 DIAGNOSIS — R635 Abnormal weight gain: Secondary | ICD-10-CM

## 2016-04-29 NOTE — Progress Notes (Signed)
Name: Jeffrey Schneider   MRN: [id number]    DOB: 1970/04/12   Date:04/29/2016       Progress Note  Subjective  Chief Complaint  Chief Complaint  Patient presents with  . Annual Exam    HPI  Male Exam: he is sexually active, with wife, he denies sexual dysfunction - he states erection not always as strong. No change in bowel movements. He has gained weight because not as active since diagnosed ACL tear a few months ago  IPSS Questionnaire (AUA-7): Over the past month.   1)  How often have you had a sensation of not emptying your bladder completely after you finish urinating?  0 - Not at all  2)  How often have you had to urinate again less than two hours after you finished urinating? 1 - Less than 1 time in 5  3)  How often have you found you stopped and started again several times when you urinated?  1 - Less than 1 time in 5  4) How difficult have you found it to postpone urination?  0 - Not at all  5) How often have you had a weak urinary stream?  0 - Not at all  6) How often have you had to push or strain to begin urination?  1 - Less than 1 time in 5  7) How many times did you most typically get up to urinate from the time you went to bed until the time you got up in the morning?  0 - None  Total score:  0-7 mildly symptomatic   8-19 moderately symptomatic   20-35 severely symptomatic     Patient Active Problem List   Diagnosis Date Noted  . Left ACL tear 01/30/2016  . Bilateral knee pain 09/27/2014  . Dyslipidemia 09/24/2014  . Decreased libido 09/24/2014  . Failure of erection 09/24/2014  . Goiter 09/24/2014  . Hemorrhoids, internal 09/24/2014  . Dysmetabolic syndrome 09/24/2014  . Obesity (BMI 30.0-34.9) 09/24/2014  . Osteoarthritis of left knee 09/24/2014  . Abnormal blood sugar 11/25/2008  . Cervical pain 11/17/2006    Past Surgical History:  Procedure Laterality Date  . ACHILLES TENDON SURGERY Left    Past Rupture    Family History  Problem Relation  Age of Onset  . Depression Mother   . Obesity Mother   . Alcohol abuse Father   . Cirrhosis Father   . Cirrhosis Sister   . Alcohol abuse Brother   . Allergic rhinitis Daughter   . Allergic rhinitis Son   . Cancer Maternal Grandmother     Colon    Social History   Social History  . Marital status: Married    Spouse name: N/A  . Number of children: 2  . Years of education: N/A   Occupational History  . programmer  [employer]   Social History Main Topics  . Smoking status: Never Smoker  . Smokeless tobacco: Never Used  . Alcohol use No  . Drug use: No  . Sexual activity: Yes    Partners: Female   Other Topics Concern  . 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  No Known Allergies   ROS  Constitutional: Negative for fever, positive for weight change.  Respiratory: Negative for cough and shortness of breath.  Cardiovascular: Negative for chest pain or palpitations.  Gastrointestinal: Negative for abdominal pain, no bowel changes.  Musculoskeletal: Negative for gait problem or joint swelling. Left knee pain  Skin: Negative for rash.  Neurological: Negative for dizziness or headache.  No other specific complaints in a complete review of systems (except as listed in HPI above).  Objective  Vitals:   04/29/16 0835  BP: 128/84  Pulse: 60  Resp: 16  Temp: 98.2 F (36.8 C)  TempSrc: Oral  SpO2: 97%  Weight: 242 lb 3.2 oz (109.9 kg)  Height:  (1.88 m)    Body mass index is 31.1 kg/m.  Physical Exam  Constitutional: Patient appears well-developed and well-nourished. No distress. No lymphadenopathy HENT: Head: Normocephalic and atraumatic. Ears: B TMs ok, no erythema or effusion; Nose: Nose normal. Mouth/Throat: Oropharynx is clear and moist. No oropharyngeal exudate.  Eyes: Conjunctivae and EOM are  normal. Pupils are equal, round, and reactive to light. No scleral icterus.  Neck: Normal range of motion. Neck supple. No JVD present. No thyromegaly present.  Cardiovascular: Normal rate, regular rhythm and normal heart sounds.  No murmur heard. No BLE edema. Pulmonary/Chest: Effort normal and breath sounds normal. No respiratory distress. Abdominal: Soft. Bowel sounds are normal, no distension. There is no tenderness. no masses MALE GENITALIA: Normal descended testes bilaterally, no masses palpated, no hernias, no lesions, no discharge RECTAL: Prostate exam not done Musculoskeletal: Normal range of motion, no joint effusions. Crepitus with extension of both knees Neurological: he is alert and oriented to person, place, and time. No cranial nerve deficit. Coordination, balance, strength, speech and gait are normal.  Skin: Skin is warm and dry. No rash noted. No erythema.  Psychiatric: Patient has a normal mood and affect. behavior is normal. Judgment and thought content normal.  PHQ2/9: Depression screen [facility] 2/9 04/29/2016 09/02/2015 02/18/2015 09/27/2014  Decreased Interest 0 0 0 0  Down, Depressed, Hopeless 0 0 0 0  PHQ - 2 Score 0 0 0 0     Fall Risk: Fall Risk  04/29/2016 09/02/2015 02/18/2015 09/27/2014  Falls in the past year? No No No Yes  Number falls in past yr: - - - 2 or more  Injury with Fall? - - - No     Functional Status Survey: Is the patient deaf or have difficulty hearing?: No Does the patient have difficulty seeing, even when wearing glasses/contacts?: No Does the patient have difficulty concentrating, remembering, or making decisions?: No Does the patient have difficulty walking or climbing stairs?: No Does the patient have difficulty dressing or bathing?: No Does the patient have difficulty doing errands alone such as visiting a doctor's office or shopping?: No    Assessment & Plan  1. Encounter for routine history and physical exam for male  Discussed  importance of 150 minutes of physical activity weekly, eat two servings of fish weekly, eat one serving of tree nuts ( cashews, pistachios, pecans, almonds.Marland Kitchen) every other day, eat 6 servings of fruit/vegetables daily and drink plenty of water and avoid sweet beverages.    2. Weight gain  He has not been as physically active because of knee pain - ACL tear and meniscal tear on left side - discussed trying swimming.   3. Obesity (BMI 30.0-34.9)  Discussed with the patient the risk posed by an increased BMI. Discussed importance of portion control, calorie counting and at least 150 minutes of physical activity weekly. Avoid sweet beverages and drink more water. Eat at least 6 servings of  fruit and vegetables daily

## 2016-06-29 ENCOUNTER — Encounter: Payer: Self-pay | Admitting: Emergency Medicine

## 2016-06-29 ENCOUNTER — Emergency Department
Admission: EM | Admit: 2016-06-29 | Discharge: 2016-06-29 | Disposition: A | Discharge status: Home / Self Care | Payer: BLUE CROSS/BLUE SHIELD | Attending: Emergency Medicine | Admitting: Emergency Medicine

## 2016-06-29 DIAGNOSIS — Y998 Other external cause status: Secondary | ICD-10-CM | POA: Diagnosis not present

## 2016-06-29 DIAGNOSIS — S0181XA Laceration without foreign body of other part of head, initial encounter: Secondary | ICD-10-CM | POA: Diagnosis not present

## 2016-06-29 DIAGNOSIS — W500XXA Accidental hit or strike by another person, initial encounter: Secondary | ICD-10-CM | POA: Diagnosis not present

## 2016-06-29 DIAGNOSIS — Y9367 Activity, basketball: Secondary | ICD-10-CM | POA: Insufficient documentation

## 2016-06-29 DIAGNOSIS — Y929 Unspecified place or not applicable: Secondary | ICD-10-CM | POA: Insufficient documentation

## 2016-06-29 MED ORDER — LIDOCAINE-EPINEPHRINE-TETRACAINE (LET) SOLUTION
NASAL | Status: AC
  Filled 2016-06-29: qty 3

## 2016-06-29 MED ORDER — LIDOCAINE-EPINEPHRINE-TETRACAINE (LET) SOLUTION: 3.0000 mL | Freq: Once | NASAL | Status: DC

## 2016-06-29 NOTE — ED Provider Notes (Signed)
[facility]-EMERGENCY DEPARTMENT Provider Note   CSN: [id number] Arrival date & time: 06/29/16  2045     History   Chief Complaint Chief Complaint  Patient presents with  . Laceration    HPI Jeffrey Schneider is a 46 y.o. male presents to the emergency department for evaluation of laceration to the left eyebrow. Patient states he was playing basketball earlier today when he was hit in the head by another player's head. He did not lose consciousness, denies any nausea or vomiting. Has had no photophobia, dizziness or lightheadedness. He has not had any medications for pain. Pain is mild. Bleeding is well controlled. Tetanus is up-to-date.  HPI  Past Medical History:  Diagnosis Date  . Abnormal glucose   . Cervicalgia   . Decreased libido   . Erectile dysfunction   . Hypogonadism, male   . Internal hemorrhoids   . Left knee pain   . Metabolic syndrome   . Obesity   . Thyroid enlarged     Patient Active Problem List   Diagnosis Date Noted  . Left ACL tear 01/30/2016  . Bilateral knee pain 09/27/2014  . Dyslipidemia 09/24/2014  . Decreased libido 09/24/2014  . Failure of erection 09/24/2014  . Goiter 09/24/2014  . Hemorrhoids, internal 09/24/2014  . Dysmetabolic syndrome 09/24/2014  . Obesity (BMI 30.0-34.9) 09/24/2014  . Osteoarthritis of left knee 09/24/2014  . Abnormal blood sugar 11/25/2008  . Cervical pain 11/17/2006    Past Surgical History:  Procedure Laterality Date  . ACHILLES TENDON SURGERY Left    Past Rupture       Home Medications    Prior to Admission medications   Not on File    Family History Family History  Problem Relation Age of Onset  . Depression Mother   . Obesity Mother   . Alcohol abuse Father   . Cirrhosis Father   . Cirrhosis Sister   . Alcohol abuse Brother   . Allergic rhinitis Daughter   . Allergic rhinitis Son   . Cancer Maternal Grandmother        Colon    Social History Social History  Substance Use Topics  .  Smoking status: Never Smoker  . Smokeless tobacco: Never Used  . Alcohol use No     Allergies   Patient has no known allergies.   Review of Systems Review of Systems  HENT: Negative for tinnitus.   Eyes: Negative for photophobia.  Respiratory: Negative for shortness of breath.   Cardiovascular: Negative for chest pain.  Gastrointestinal: Negative for nausea and vomiting.  Musculoskeletal: Negative for arthralgias, back pain and neck pain.  Skin: Positive for wound.  Neurological: Negative for dizziness, weakness, numbness and headaches.     Physical Exam Updated Vital Signs BP 123/76 (BP Location: Left Arm)   Pulse 65   Temp 97.6 F (36.4 C) (Oral)   Resp 16   Wt 109.8 kg (242 lb)   SpO2 98%   BMI 31.07 kg/m   Physical Exam  Constitutional: He is oriented to person, place, and time. He appears well-developed and well-nourished.  HENT:  Head: Normocephalic and atraumatic.  Eyes: Conjunctivae and EOM are normal. Pupils are equal, round, and reactive to light.  Neck: Normal range of motion. Neck supple.  Cardiovascular: Normal rate.   Pulmonary/Chest: Effort normal. No respiratory distress.  Neurological: He is alert and oriented to person, place, and time. No cranial nerve deficit. Coordination normal.  Skin:  1.5 cm laceration to the left brow, linear  with no sign of foreign body. No tenderness along the orbital rim. No pain with extraocular eye movement. No limited range of motion.     ED Treatments / Results  Labs (all labs ordered are listed, but only abnormal results are displayed) Labs Reviewed - No data to display  EKG  EKG Interpretation None       Radiology No results found.  Procedures Procedures (including critical care time) LACERATION REPAIR Performed by: Patience Musca Authorized by: Patience Musca Consent: Verbal consent obtained. Risks and benefits: risks, benefits and alternatives were discussed Consent  given by: patient Patient identity confirmed: provided demographic data Prepped and Draped in normal sterile fashion Wound explored  Laceration Location: Left brow  Laceration Length: 1.5 cm  No Foreign Bodies seen or palpated  Anesthesia: local infiltration  Local anesthetic: None   Irrigation method: syringe Amount of cleaning: standard  Skin closure: Dermabond   Technique: Dermabond   Patient tolerance: Patient tolerated the procedure well with no immediate complications.   Medications Ordered in ED Medications - No data to display   Initial Impression / Assessment and Plan / ED Course  I have reviewed the triage vital signs and the nursing notes.  Pertinent labs & imaging results that were available during my care of the patient were reviewed by me and considered in my medical decision making (see chart for details).     46 year old male with laceration to the left brow. No headache, loss of consciousness, nausea or vomiting. Tetanus is up-to-date. Laceration repaired with Dermabond. He is educated on wound care.  Final Clinical Impressions(s) / ED Diagnoses   Final diagnoses:  Laceration of forehead, initial encounter    New Prescriptions There are no discharge medications for this patient.    Evon Slack, PA-C 06/29/16 2340    Merrily Brittle, MD 07/07/16 1054

## 2016-06-29 NOTE — ED Triage Notes (Signed)
Pt playing basketball today when another player collided with pt, leaving a 1 inch laceration above the left eye. Laceration at this time is not bleeding, pt holding 2x2 on area.

## 2016-06-29 NOTE — Discharge Instructions (Signed)
You may shower but do not submerge Dermabond underwater. Return to the ER for any headaches, vision changes, nausea, vomiting, worsening symptoms urgent changes in her health. He may remove Dermabond in 10-14 days if still attached.

## 2016-10-29 ENCOUNTER — Ambulatory Visit (INDEPENDENT_AMBULATORY_CARE_PROVIDER_SITE_OTHER): Payer: BLUE CROSS/BLUE SHIELD | Admitting: Family Medicine

## 2016-10-29 ENCOUNTER — Encounter: Payer: Self-pay | Admitting: Family Medicine

## 2016-10-29 VITALS — BP 96/70 | HR 62 | Resp 14 | Ht 74.0 in | Wt 245.6 lb

## 2016-10-29 DIAGNOSIS — R5383 Other fatigue: Secondary | ICD-10-CM | POA: Diagnosis not present

## 2016-10-29 DIAGNOSIS — E785 Hyperlipidemia, unspecified: Secondary | ICD-10-CM

## 2016-10-29 DIAGNOSIS — Z23 Encounter for immunization: Secondary | ICD-10-CM

## 2016-10-29 DIAGNOSIS — E8881 Metabolic syndrome: Secondary | ICD-10-CM

## 2016-10-29 DIAGNOSIS — E049 Nontoxic goiter, unspecified: Secondary | ICD-10-CM

## 2016-10-29 DIAGNOSIS — E669 Obesity, unspecified: Secondary | ICD-10-CM | POA: Diagnosis not present

## 2016-10-29 NOTE — Progress Notes (Addendum)
Name: Jeffrey Schneider   MRN: [id number]    DOB: March 27, 1970   Date:10/29/2016       Progress Note  Subjective  Chief Complaint  Chief Complaint  Patient presents with  . Hyperlipidemia  . Obesity    HPI  Goiter: had thyroid US 02/2014 , slightly enlarged - no nodules,  we will recheck thyroid function. He has been feeling very tired lately, but he states working two jobs and going to lab late and waking up early, working 70 hours per week.  Denies dry skin no dysphagia. He has noticed stools dryer than usual, still has daily bowel movements  Metabolic Syndrome: HDL has improved, triglycerides within normal limits. He denies polyphagia, polyuria or polydipsia at this time. We will recheck labs today  Obesity: he took a break from playing basketball, but recently starting going back again, varicose veins not bothering him as much now. He is eating two meals a day, usually skipping dinner. Discussed importance of regular exercises.    Patient Active Problem List   Diagnosis Date Noted  . Left ACL tear 01/30/2016  . Bilateral knee pain 09/27/2014  . Dyslipidemia 09/24/2014  . Decreased libido 09/24/2014  . Goiter 09/24/2014  . Hemorrhoids, internal 09/24/2014  . Dysmetabolic syndrome 09/24/2014  . Obesity (BMI 30.0-34.9) 09/24/2014  . Osteoarthritis of left knee 09/24/2014  . Abnormal blood sugar 11/25/2008  . Cervical pain 11/17/2006    Past Surgical History:  Procedure Laterality Date  . ACHILLES TENDON SURGERY Left    Past Rupture    Family History  Problem Relation Age of Onset  . Depression Mother   . Obesity Mother   . Alcohol abuse Father   . Cirrhosis Father   . Cirrhosis Sister   . Alcohol abuse Brother   . Allergic rhinitis Daughter   . Allergic rhinitis Son   . Cancer Maternal Grandmother        Colon    Social History   Social History  . Marital status: Married    Spouse name: N/A  . Number of children: 2  . Years of education: N/A    Occupational History  . programmer  [employer]   Social History Main Topics  . Smoking status: Never Smoker  . Smokeless tobacco: Never Used  . Alcohol use No  . Drug use: No  . Sexual activity: Yes    Partners: Female   Other Topics Concern  . 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  Ibuprofen (ADVIL) 200 MG CAPS, Advil, Disp: , Rfl:   No Known Allergies   ROS  Constitutional: Negative for fever or weight change.  Respiratory: Negative for cough and shortness of breath.   Cardiovascular: Negative for chest pain or palpitations.  Gastrointestinal: Negative for abdominal pain, no bowel changes.  Musculoskeletal: Negative for gait problem or joint swelling.  Skin: Negative for rash.  Neurological: Negative for dizziness or headache.  No other specific complaints in a complete review of systems (except as listed in HPI above).  Objective  Vitals:   10/29/16 0808  BP: 96/70  Pulse: 62  Resp: 14  SpO2: 98%  Weight: 245 lb 9.6 oz (111.4 kg)  Height:  (1.88 m)    Body mass index is 31.53 kg/m.  Physical Exam  Constitutional: Patient appears  well-developed and well-nourished. Obese No distress.  HEENT: head atraumatic, normocephalic, pupils equal and reactive to light,neck supple, throat within normal limits,mild thyromegaly Cardiovascular: Normal rate, regular rhythm and normal heart sounds.  No murmur heard. No BLE edema. Pulmonary/Chest: Effort normal and breath sounds normal. No respiratory distress. Abdominal: Soft.  There is no tenderness. Psychiatric: Patient has a normal mood and affect. behavior is normal. Judgment and thought content normal.   PHQ2/9: Depression screen [facility] 2/9 04/29/2016 09/02/2015 02/18/2015 09/27/2014  Decreased Interest 0 0 0 0  Down, Depressed, Hopeless 0 0 0 0  PHQ - 2 Score 0 0 0 0     Fall Risk: Fall Risk  10/29/2016 04/29/2016 09/02/2015 02/18/2015 09/27/2014  Falls in the past year? No No No No Yes  Number falls in past yr: - - - - 2 or more  Comment - - - - basketball  Injury with Fall? - - - - No     Assessment & Plan  1. Dyslipidemia  - Lipid panel  2. Need for immunization against influenza  - Flu Vaccine QUAD 36+ mos IM  3. Obesity (BMI 30.0-34.9)  Discussed with the patient the risk posed by an increased BMI. Discussed importance of portion control, calorie counting and at least 150 minutes of physical activity weekly. Avoid sweet beverages and drink more water. Eat at least 6 servings of fruit and vegetables daily   4. Dysmetabolic syndrome  - Hemoglobin A1c - Insulin, fasting  5. Goiter  - Thyroid Panel With TSH  6. Other fatigue  - COMPLETE METABOLIC PANEL WITH GFR - CBC

## 2016-11-01 LAB — COMPLETE METABOLIC PANEL WITH GFR
AG Ratio: 1.5 (calc) (ref 1.0–2.5)
ALKALINE PHOSPHATASE (APISO): 64 U/L (ref 40–115)
ALT: 17 U/L (ref 9–46)
AST: 15 U/L (ref 10–40)
Albumin: 4.2 g/dL (ref 3.6–5.1)
BILIRUBIN TOTAL: 0.7 mg/dL (ref 0.2–1.2)
BUN: 14 mg/dL (ref 7–25)
CHLORIDE: 106 mmol/L (ref 98–110)
CO2: 29 mmol/L (ref 20–32)
Calcium: 9.5 mg/dL (ref 8.6–10.3)
Creat: 1.28 mg/dL (ref 0.60–1.35)
GFR, EST AFRICAN AMERICAN: 79 mL/min/{1.73_m2} (ref >=60)
GFR, EST NON AFRICAN AMERICAN: 68 mL/min/{1.73_m2} (ref >=60)
GLUCOSE: 97 mg/dL (ref 65–99)
Globulin: 2.8 g/dL (calc) (ref 1.9–3.7)
POTASSIUM: 4 mmol/L (ref 3.5–5.3)
Sodium: 141 mmol/L (ref 135–146)
TOTAL PROTEIN: 7 g/dL (ref 6.1–8.1)

## 2016-11-01 LAB — CBC
HCT: 39.6 % (ref 38.5–50.0)
HEMOGLOBIN: 13.6 g/dL (ref 13.2–17.1)
MCH: 28.8 pg (ref 27.0–33.0)
MCHC: 34.3 g/dL (ref 32.0–36.0)
MCV: 83.9 fL (ref 80.0–100.0)
MPV: 10 fL (ref 7.5–12.5)
Platelets: 189 10*3/uL (ref 140–400)
RBC: 4.72 10*6/uL (ref 4.20–5.80)
RDW: 13.1 % (ref 11.0–15.0)
WBC: 5.7 10*3/uL (ref 3.8–10.8)

## 2016-11-01 LAB — LIPID PANEL
CHOL/HDL RATIO: 3.6 (calc) (ref <5.0)
Cholesterol: 156 mg/dL (ref <200)
HDL: 43 mg/dL (ref >40)
LDL CHOLESTEROL (CALC): 97 mg/dL
Non-HDL Cholesterol (Calc): 113 mg/dL (calc) (ref <130)
Triglycerides: 74 mg/dL (ref <150)

## 2016-11-01 LAB — HEMOGLOBIN A1C
HEMOGLOBIN A1C: 5.6 %{Hb} (ref <5.7)
Mean Plasma Glucose: 114 (calc)
eAG (mmol/L): 6.3 (calc)

## 2016-11-01 LAB — THYROID PANEL WITH TSH
Free Thyroxine Index: 2.6 (ref 1.4–3.8)
T3 UPTAKE: 37 % — AB (ref 22–35)
T4, Total: 7.1 ug/dL (ref 4.9–10.5)
TSH: 1.06 mIU/L (ref 0.40–4.50)

## 2016-11-01 LAB — INSULIN, RANDOM: Insulin: 4.9 u[IU]/mL (ref 2.0–19.6)

## 2017-04-28 ENCOUNTER — Encounter: Payer: BLUE CROSS/BLUE SHIELD | Admitting: Family Medicine

## 2017-05-09 ENCOUNTER — Ambulatory Visit (INDEPENDENT_AMBULATORY_CARE_PROVIDER_SITE_OTHER): Payer: BLUE CROSS/BLUE SHIELD | Admitting: Family Medicine

## 2017-05-09 ENCOUNTER — Encounter: Payer: Self-pay | Admitting: Family Medicine

## 2017-05-09 VITALS — BP 120/78 | HR 75 | Temp 98.4°F | Resp 16 | Ht 74.41 in | Wt 262.3 lb

## 2017-05-09 DIAGNOSIS — E669 Obesity, unspecified: Secondary | ICD-10-CM | POA: Diagnosis not present

## 2017-05-09 DIAGNOSIS — Z0000 Encounter for general adult medical examination without abnormal findings: Secondary | ICD-10-CM | POA: Diagnosis not present

## 2017-05-09 NOTE — Patient Instructions (Signed)

## 2017-05-09 NOTE — Progress Notes (Signed)
Name: Jeffrey Schneider   MRN: [id number]    DOB: 24-Jan-1970   Date:05/09/2017       Progress Note  Subjective  Chief Complaint  Chief Complaint  Patient presents with  . Annual Exam    HPI  Patient presents for annual CPE   USPSTF grade A and B recommendations:  Diet: eating more bread lately, and eating late at night Exercise: very active - goes to [facility]  Depression:  Depression screen [facility] 2/9 05/09/2017 04/29/2016 09/02/2015 02/18/2015 09/27/2014  Decreased Interest 0 0 0 0 0  Down, Depressed, Hopeless 0 0 0 0 0  PHQ - 2 Score 0 0 0 0 0   IPSS Questionnaire (AUA-7): Over the past month.   1)  How often have you had a sensation of not emptying your bladder completely after you finish urinating?  0 - Not at all  2)  How often have you had to urinate again less than two hours after you finished urinating? 1 - Less than 1 time in 5  3)  How often have you found you stopped and started again several times when you urinated?  0 - Not at all  4) How difficult have you found it to postpone urination?  0 - Not at all  5) How often have you had a weak urinary stream?  0 - Not at all  6) How often have you had to push or strain to begin urination?  1 - Less than 1 time in 5  7) How many times did you most typically get up to urinate from the time you went to bed until the time you got up in the morning?  1 - 1 time  Total score:  0-7 mildly symptomatic   8-19 moderately symptomatic   20-35 severely symptomatic     Hypertension:  BP Readings from Last 3 Encounters:  05/09/17 120/78  10/29/16 96/70  06/29/16 123/76    Obesity: Wt Readings from Last 3 Encounters:  05/09/17 262 lb 4.8 oz (119 kg)  10/29/16 245 lb 9.6 oz (111.4 kg)  06/29/16 242 lb (109.8 kg)   BMI Readings from Last 3 Encounters:  05/09/17 33.31 kg/m  10/29/16 31.53 kg/m  06/29/16 31.07 kg/m     Lipids:  Lab Results  Component Value Date   CHOL 156 10/29/2016   CHOL 145 09/02/2015   CHOL 159  11/01/2014   Lab Results  Component Value Date   HDL 43 10/29/2016   HDL 44 09/02/2015   HDL 45 11/01/2014   Lab Results  Component Value Date   LDLCALC 97 10/29/2016   LDLCALC 86 09/02/2015   LDLCALC 97 11/01/2014   Lab Results  Component Value Date   TRIG 74 10/29/2016   TRIG 75 09/02/2015   TRIG 85 11/01/2014   Lab Results  Component Value Date   CHOLHDL 3.6 10/29/2016   CHOLHDL 3.3 09/02/2015   No results found for: LDLDIRECT Glucose:  Glucose, Bld  Date Value Ref Range Status  10/29/2016 97 65 - 99 mg/dL Final    Comment:    .            Fasting reference interval .   09/02/2015 97 65 - 99 mg/dL Final    Married STD testing and prevention (chl/gon/syphilis):  HIV: not interested  Colorectal cancer: discussed being AA increases chance of colon cancer and can start at age 49 - he would like to wait until age 34 at least  Prostate cancer: discussed  USPTF    Lab Results  Component Value Date   PSA Normal, 0.5 02/21/2014   Advanced Care Planning: A voluntary discussion about advance care planning including the explanation and discussion of advance directives.  Discussed health care proxy and Living will, and the patient was able to identify a health care proxy as wife  Patient does not have a living will at present time. If patient does have living will, I have requested they bring this to the clinic to be scanned in to their chart.  Patient Active Problem List   Diagnosis Date Noted  . Left ACL tear 01/30/2016  . Bilateral knee pain 09/27/2014  . Dyslipidemia 09/24/2014  . Decreased libido 09/24/2014  . Goiter 09/24/2014  . Hemorrhoids, internal 09/24/2014  . Dysmetabolic syndrome 09/24/2014  . Obesity (BMI 30.0-34.9) 09/24/2014  . Osteoarthritis of left knee 09/24/2014  . Abnormal blood sugar 11/25/2008  . Cervical pain 11/17/2006    Past Surgical History:  Procedure Laterality Date  . ACHILLES TENDON SURGERY Left    Past Rupture    Family  History  Problem Relation Age of Onset  . Depression Mother   . Obesity Mother   . Alcohol abuse Father   . Cirrhosis Father   . Cirrhosis Sister   . Alcohol abuse Brother   . Allergic rhinitis Daughter   . Allergic rhinitis Son   . Cancer Maternal Grandmother        Colon    Social History   Socioeconomic History  . Marital status: Married    Spouse name: Not on file  . Number of children: 2  . Years of education: Not on file  . Highest education level: Master's degree (e.g., MA, MS, MEng, MEd, MSW, MBA)  Occupational History  . Occupation: Associate Professor: [employer]  Social Needs  . Financial resource strain: Somewhat hard  . Food insecurity:    Worry: Never true    Inability: Never true  . Transportation needs:    Medical: No    Non-medical: No  Tobacco Use  . Smoking status: Never Smoker  . Smokeless tobacco: Never Used  Substance and Sexual Activity  . Alcohol use: No    Alcohol/week: 0.0 oz  . Drug use: No  . Sexual activity: Yes    Partners: Female  Lifestyle  . Physical activity:    Days per week: 6 days    Minutes per session: 90 min  . Stress: Not at all  Relationships  . Social connections:    Talks on phone: Twice a week    Gets together: Once a week    Attends religious service: More than 4 times per year    Active member of club or organization: Yes    Attends meetings of clubs or organizations: More than 4 times per year    Relationship status: Married  . Intimate partner violence:    Fear of current or ex partner: No    Emotionally abused: No    Physically abused: No    Forced sexual activity: No  Other Topics Concern  . 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  Ibuprofen (ADVIL) 200 MG CAPS, Advil, Disp: , Rfl:   No Known Allergies   ROS  Constitutional: Negative for fever,  positive for  weight  change.  Respiratory: Negative for cough and shortness of breath.   Cardiovascular: Negative for chest pain or palpitations.  Gastrointestinal: Negative for abdominal pain, no bowel changes.  Musculoskeletal: Negative for gait problem or joint swelling.  Skin: Negative for rash.  Neurological: Negative for dizziness or headache.  No other specific complaints in a complete review of systems (except as listed in HPI above).   Objective  Vitals:   05/09/17 0834  BP: 120/78  Pulse: 75  Resp: 16  Temp: 98.4 F (36.9 C)  TempSrc: Oral  SpO2: 97%  Weight: 262 lb 4.8 oz (119 kg)  Height: 6' 2.41" (1.89 m)    Body mass index is 33.31 kg/m.  Physical Exam  Constitutional: Patient appears well-developed and obese.  No distress.  HENT: Head: Normocephalic and atraumatic. Ears: B TMs ok, no erythema or effusion; Nose: Nose normal. Mouth/Throat: Oropharynx is clear and moist. No oropharyngeal exudate.  Eyes: Conjunctivae and EOM are normal. Pupils are equal, round, and reactive to light. No scleral icterus.  Neck: Normal range of motion. Neck supple. No JVD present. No thyromegaly present.  Cardiovascular: Normal rate, regular rhythm and normal heart sounds.  No murmur heard. No BLE edema. Pulmonary/Chest: Effort normal and breath sounds normal. No respiratory distress. Abdominal: Soft. Bowel sounds are normal, no distension. There is no tenderness. no masses MALE GENITALIA: Normal descended testes bilaterally, no masses palpated, no hernias, no lesions, no discharge RECTAL: not done Musculoskeletal: Normal range of motion, no joint effusions. No gross deformities Neurological: he is alert and oriented to person, place, and time. No cranial nerve deficit. Coordination, balance, strength, speech and gait are normal.  Skin: Skin is warm and dry. No rash noted. No erythema.  Psychiatric: Patient has a normal mood and affect. behavior is normal. Judgment and thought  content normal.    [facility] 05/09/2017 04/29/2016 09/02/2015 02/18/2015 09/27/2014  Decreased Interest 0 0 0 0 0  Down, Depressed, Hopeless 0 0 0 0 0  PHQ - 2 Score 0 0 0 0 0     Fall Risk: Fall Risk  05/09/2017 10/29/2016 04/29/2016 09/02/2015 02/18/2015  Falls in the past year? No No No No No  Number falls in past yr: - - - - -  Comment - - - - -  Injury with Fall? - - - - -     Functional Status Survey: Is the patient deaf or have difficulty hearing?: No Does the patient have difficulty seeing, even when wearing glasses/contacts?: No Does the patient have difficulty concentrating, remembering, or making decisions?: No Does the patient have difficulty walking or climbing stairs?: No Does the patient have difficulty dressing or bathing?: No Does the patient have difficulty doing errands alone such as visiting a doctor's office or shopping?: No    Assessment & Plan  1. Encounter for routine history and physical exam for male  Discussed importance of 150 minutes of physical activity weekly, eat two servings of fish weekly, eat one serving of tree nuts ( cashews, pistachios, pecans, almonds.Marland Kitchen.) every other day, eat 6 servings of fruit/vegetables daily and drink plenty of water and avoid sweet beverages.   2. Obesity (BMI 30.0-34.9)  He gained 18 lbs since last visit, he will resume diet and exercise

## 2017-08-11 ENCOUNTER — Ambulatory Visit: Payer: BLUE CROSS/BLUE SHIELD | Admitting: Family Medicine

## 2017-08-11 ENCOUNTER — Encounter: Payer: Self-pay | Admitting: Family Medicine

## 2017-08-11 VITALS — BP 130/80 | HR 73 | Temp 98.2°F | Resp 18 | Ht 74.0 in | Wt 263.4 lb

## 2017-08-11 DIAGNOSIS — S39012A Strain of muscle, fascia and tendon of lower back, initial encounter: Secondary | ICD-10-CM

## 2017-08-11 MED ORDER — MELOXICAM 15 MG PO TABS
15.0000 mg | ORAL_TABLET | Freq: Every day | ORAL | 0 refills | Status: DC
Start: 2017-08-11 — End: 2017-11-08

## 2017-08-11 MED ORDER — TIZANIDINE HCL 4 MG PO TABS: 4.0000 mg | ORAL_TABLET | Freq: Four times a day (QID) | ORAL | 0 refills | Status: DC | PRN

## 2017-08-11 NOTE — Progress Notes (Signed)
Name: Jeffrey Schneider   MRN: [id number]    DOB: 1970/07/06   Date:08/11/2017       Progress Note  Subjective  Chief Complaint  Chief Complaint  Patient presents with  . Back Pain    pulled muscle on June 15th playing basketball. Pain getting worse, hard to sleep    HPI   Back pain started over a week ago - on Monday he played basketball, then went home to cut the grass. That night he felt a significant spasm in his back. Tuesday he went on Vacation, took some tylenol, the pain was minimal.  Then 4 days ago he started having increasingly worsening pain where he hasn't been able to go to the gym.  Back Pain  Location: right lumbar area Radiates to:  does not radiate Pain severity:  4 /10, gets up to a 8/10 Pain quality:  intermittent Onset quality:  irritating/pulsating Duration:  Sudden Timing: intermittent- worse at night Progression:gradually worsening Context (when does pain occur): at night is the worst pain when laying on his back, twisting, walking. Relieved by: Nothing at this time. Ineffective treatments: rest, heat, acetaminophen, ibuprofen (OTC) and icy-hot. Associated symptoms: None Red Flags for Back Pain: None Denies numbness/tingling, weakness of LE.   Patient Active Problem List   Diagnosis Date Noted  . Left ACL tear 01/30/2016  . Bilateral knee pain 09/27/2014  . Dyslipidemia 09/24/2014  . Decreased libido 09/24/2014  . Goiter 09/24/2014  . Hemorrhoids, internal 09/24/2014  . Dysmetabolic syndrome 09/24/2014  . Obesity (BMI 30.0-34.9) 09/24/2014  . Osteoarthritis of left knee 09/24/2014  . Abnormal blood sugar 11/25/2008  . Cervical pain 11/17/2006    Social History   Tobacco Use  . Smoking status: Never Smoker  . Smokeless tobacco: Never Used  Substance Use Topics  . Alcohol use: No    Alcohol/week: 0.0 oz     Current Outpatient Medications:  .  Ibuprofen (ADVIL) 200 MG CAPS, Advil, Disp: , Rfl:  .  meloxicam (MOBIC) 15 MG tablet,  Take 1 tablet (15 mg total) by mouth daily., Disp: 30 tablet, Rfl: 0 .  tiZANidine (ZANAFLEX) 4 MG tablet, Take 1 tablet (4 mg total) by mouth every 6 (six) hours as needed for muscle spasms., Disp: 30 tablet, Rfl: 0  No Known Allergies  ROS  Ten systems reviewed and is negative except as mentioned in HPI  Objective  Vitals:   08/11/17 0843  BP: 130/80  Pulse: 73  Resp: 18  Temp: 98.2 F (36.8 C)  TempSrc: Oral  SpO2: 97%  Weight: 263 lb 6.4 oz (119.5 kg)  Height: 6\' 2"  (1.88 m)   Body mass index is 33.82 kg/m.  Nursing Note and Vital Signs reviewed.  Physical Exam  Constitutional: He is oriented to person, place, and time. He appears well-developed and well-nourished.  HENT:  Head: Normocephalic and atraumatic.  Right Ear: Tympanic membrane, external ear and ear canal normal.  Left Ear: Tympanic membrane, external ear and ear canal normal.  Nose: Nose normal.  Mouth/Throat: Uvula is midline and mucous membranes are normal. No tonsillar exudate.  Eyes: Conjunctivae and EOM are normal. No scleral icterus.  Neck: Normal range of motion. Neck supple.  Cardiovascular: Normal rate, regular rhythm and normal heart sounds.  Pulmonary/Chest: Effort normal and breath sounds normal. No respiratory distress.  Abdominal: Soft. Bowel sounds are normal. He exhibits no mass. There is no tenderness. There is no rebound.  Musculoskeletal: Normal range of motion. He exhibits no edema.  Lumbar back: He exhibits tenderness, pain and spasm. He exhibits normal range of motion, no bony tenderness and no edema.       Back:  Lymphadenopathy:    He has no cervical adenopathy.  Neurological: He is alert and oriented to person, place, and time. No cranial nerve deficit.  Skin: Skin is warm and dry. No rash noted. No erythema.  Psychiatric: He has a normal mood and affect. His behavior is normal. Judgment and thought content normal.  Nursing note and vitals reviewed.    No results found  for this or any previous visit (from the past 72 hour(s)).  Assessment & Plan  1. Strain of lumbar region, initial encounter - tiZANidine (ZANAFLEX) 4 MG tablet; Take 1 tablet (4 mg total) by mouth every 6 (six) hours as needed for muscle spasms.  Dispense: 30 tablet; Refill: 0 - meloxicam (MOBIC) 15 MG tablet; Take 1 tablet (15 mg total) by mouth daily.  Dispense: 30 tablet; Refill: 0  -Red flags and when to present for emergency care or RTC including fever >101.67F, chest pain, shortness of breath, new/worsening/un-resolving symptoms, numbness/tingling/weakness of an extremity reviewed with patient at time of visit. Follow up and care instructions discussed and provided in AVS.

## 2017-08-15 ENCOUNTER — Ambulatory Visit: Payer: BLUE CROSS/BLUE SHIELD | Admitting: Family Medicine

## 2017-09-03 ENCOUNTER — Other Ambulatory Visit: Payer: Self-pay | Admitting: Family Medicine

## 2017-09-03 DIAGNOSIS — S39012A Strain of muscle, fascia and tendon of lower back, initial encounter: Secondary | ICD-10-CM

## 2017-11-05 DIAGNOSIS — Z23 Encounter for immunization: Secondary | ICD-10-CM | POA: Diagnosis not present

## 2017-11-08 ENCOUNTER — Ambulatory Visit (INDEPENDENT_AMBULATORY_CARE_PROVIDER_SITE_OTHER): Payer: BLUE CROSS/BLUE SHIELD | Admitting: Family Medicine

## 2017-11-08 ENCOUNTER — Encounter: Payer: Self-pay | Admitting: Family Medicine

## 2017-11-08 VITALS — BP 118/70 | HR 81 | Temp 97.9°F | Resp 16 | Ht 74.0 in | Wt 256.3 lb

## 2017-11-08 DIAGNOSIS — E785 Hyperlipidemia, unspecified: Secondary | ICD-10-CM | POA: Diagnosis not present

## 2017-11-08 DIAGNOSIS — S83249A Other tear of medial meniscus, current injury, unspecified knee, initial encounter: Secondary | ICD-10-CM | POA: Insufficient documentation

## 2017-11-08 DIAGNOSIS — E8881 Metabolic syndrome: Secondary | ICD-10-CM | POA: Diagnosis not present

## 2017-11-08 DIAGNOSIS — E669 Obesity, unspecified: Secondary | ICD-10-CM | POA: Diagnosis not present

## 2017-11-08 DIAGNOSIS — G8929 Other chronic pain: Secondary | ICD-10-CM

## 2017-11-08 DIAGNOSIS — M1712 Unilateral primary osteoarthritis, left knee: Secondary | ICD-10-CM

## 2017-11-08 DIAGNOSIS — Z79899 Other long term (current) drug therapy: Secondary | ICD-10-CM

## 2017-11-08 DIAGNOSIS — E049 Nontoxic goiter, unspecified: Secondary | ICD-10-CM

## 2017-11-08 DIAGNOSIS — M25511 Pain in right shoulder: Secondary | ICD-10-CM

## 2017-11-08 DIAGNOSIS — S83249S Other tear of medial meniscus, current injury, unspecified knee, sequela: Secondary | ICD-10-CM

## 2017-11-08 NOTE — Patient Instructions (Signed)
Shoulder Exercises Ask your health care provider which exercises are safe for you. Do exercises exactly as told by your health care provider and adjust them as directed. It is normal to feel mild stretching, pulling, tightness, or discomfort as you do these exercises, but you should stop right away if you feel sudden pain or your pain gets worse.Do not begin these exercises until told by your health care provider. RANGE OF MOTION EXERCISES These exercises warm up your muscles and joints and improve the movement and flexibility of your shoulder. These exercises also help to relieve pain, numbness, and tingling. These exercises involve stretching your injured shoulder directly. Exercise A: Pendulum  1. Stand near a wall or a surface that you can hold onto for balance. 2. Bend at the waist and let your left / right arm hang straight down. Use your other arm to support you. Keep your back straight and do not lock your knees. 3. Relax your left / right arm and shoulder muscles, and move your hips and your trunk so your left / right arm swings freely. Your arm should swing because of the motion of your body, not because you are using your arm or shoulder muscles. 4. Keep moving your body so your arm swings in the following directions, as told by your health care provider: ? Side to side. ? Forward and backward. ? In clockwise and counterclockwise circles. 5. Continue each motion for __________ seconds, or for as long as told by your health care provider. 6. Slowly return to the starting position. Repeat __________ times. Complete this exercise __________ times a day. Exercise B:Flexion, Standing  1. Stand and hold a broomstick, a cane, or a similar object. Place your hands a little more than shoulder-width apart on the object. Your left / right hand should be palm-up, and your other hand should be palm-down. 2. Keep your elbow straight and keep your shoulder muscles relaxed. Push the stick down with  your healthy arm to raise your left / right arm in front of your body, and then over your head until you feel a stretch in your shoulder. ? Avoid shrugging your shoulder while you raise your arm. Keep your shoulder blade tucked down toward the middle of your back. 3. Hold for __________ seconds. 4. Slowly return to the starting position. Repeat __________ times. Complete this exercise __________ times a day. Exercise C: Abduction, Standing 1. Stand and hold a broomstick, a cane, or a similar object. Place your hands a little more than shoulder-width apart on the object. Your left / right hand should be palm-up, and your other hand should be palm-down. 2. While keeping your elbow straight and your shoulder muscles relaxed, push the stick across your body toward your left / right side. Raise your left / right arm to the side of your body and then over your head until you feel a stretch in your shoulder. ? Do not raise your arm above shoulder height, unless your health care provider tells you to do that. ? Avoid shrugging your shoulder while you raise your arm. Keep your shoulder blade tucked down toward the middle of your back. 3. Hold for __________ seconds. 4. Slowly return to the starting position. Repeat __________ times. Complete this exercise __________ times a day. Exercise D:Internal Rotation  1. Place your left / right hand behind your back, palm-up. 2. Use your other hand to dangle an exercise band, a towel, or a similar object over your shoulder. Grasp the band with   your left / right hand so you are holding onto both ends. 3. Gently pull up on the band until you feel a stretch in the front of your left / right shoulder. ? Avoid shrugging your shoulder while you raise your arm. Keep your shoulder blade tucked down toward the middle of your back. 4. Hold for __________ seconds. 5. Release the stretch by letting go of the band and lowering your hands. Repeat __________ times. Complete  this exercise __________ times a day. STRETCHING EXERCISES These exercises warm up your muscles and joints and improve the movement and flexibility of your shoulder. These exercises also help to relieve pain, numbness, and tingling. These exercises are done using your healthy shoulder to help stretch the muscles of your injured shoulder. Exercise E: Corner Stretch (External Rotation and Abduction)  1. Stand in a doorway with one of your feet slightly in front of the other. This is called a staggered stance. If you cannot reach your forearms to the door frame, stand facing a corner of a room. 2. Choose one of the following positions as told by your health care provider: ? Place your hands and forearms on the door frame above your head. ? Place your hands and forearms on the door frame at the height of your head. ? Place your hands on the door frame at the height of your elbows. 3. Slowly move your weight onto your front foot until you feel a stretch across your chest and in the front of your shoulders. Keep your head and chest upright and keep your abdominal muscles tight. 4. Hold for __________ seconds. 5. To release the stretch, shift your weight to your back foot. Repeat __________ times. Complete this stretch __________ times a day. Exercise F:Extension, Standing 1. Stand and hold a broomstick, a cane, or a similar object behind your back. ? Your hands should be a little wider than shoulder-width apart. ? Your palms should face away from your back. 2. Keeping your elbows straight and keeping your shoulder muscles relaxed, move the stick away from your body until you feel a stretch in your shoulder. ? Avoid shrugging your shoulders while you move the stick. Keep your shoulder blade tucked down toward the middle of your back. 3. Hold for __________ seconds. 4. Slowly return to the starting position. Repeat __________ times. Complete this exercise __________ times a day. STRENGTHENING  EXERCISES These exercises build strength and endurance in your shoulder. Endurance is the ability to use your muscles for a long time, even after they get tired. Exercise G:External Rotation  1. Sit in a stable chair without armrests. 2. Secure an exercise band at elbow height on your left / right side. 3. Place a soft object, such as a folded towel or a small pillow, between your left / right upper arm and your body to move your elbow a few inches away (about 10 cm) from your side. 4. Hold the end of the band so it is tight and there is no slack. 5. Keeping your elbow pressed against the soft object, move your left / right forearm out, away from your abdomen. Keep your body steady so only your forearm moves. 6. Hold for __________ seconds. 7. Slowly return to the starting position. Repeat __________ times. Complete this exercise __________ times a day. Exercise H:Shoulder Abduction  1. Sit in a stable chair without armrests, or stand. 2. Hold a __________ weight in your left / right hand, or hold an exercise band with both hands.   3. Start with your arms straight down and your left / right palm facing in, toward your body. 4. Slowly lift your left / right hand out to your side. Do not lift your hand above shoulder height unless your health care provider tells you that this is safe. ? Keep your arms straight. ? Avoid shrugging your shoulder while you do this movement. Keep your shoulder blade tucked down toward the middle of your back. 5. Hold for __________ seconds. 6. Slowly lower your arm, and return to the starting position. Repeat __________ times. Complete this exercise __________ times a day. Exercise I:Shoulder Extension 1. Sit in a stable chair without armrests, or stand. 2. Secure an exercise band to a stable object in front of you where it is at shoulder height. 3. Hold one end of the exercise band in each hand. Your palms should face each other. 4. Straighten your elbows and  lift your hands up to shoulder height. 5. Step back, away from the secured end of the exercise band, until the band is tight and there is no slack. 6. Squeeze your shoulder blades together as you pull your hands down to the sides of your thighs. Stop when your hands are straight down by your sides. Do not let your hands go behind your body. 7. Hold for __________ seconds. 8. Slowly return to the starting position. Repeat __________ times. Complete this exercise __________ times a day. Exercise J:Standing Shoulder Row 1. Sit in a stable chair without armrests, or stand. 2. Secure an exercise band to a stable object in front of you so it is at waist height. 3. Hold one end of the exercise band in each hand. Your palms should be in a thumbs-up position. 4. Bend each of your elbows to an "L" shape (about 90 degrees) and keep your upper arms at your sides. 5. Step back until the band is tight and there is no slack. 6. Slowly pull your elbows back behind you. 7. Hold for __________ seconds. 8. Slowly return to the starting position. Repeat __________ times. Complete this exercise __________ times a day. Exercise K:Shoulder Press-Ups  1. Sit in a stable chair that has armrests. Sit upright, with your feet flat on the floor. 2. Put your hands on the armrests so your elbows are bent and your fingers are pointing forward. Your hands should be about even with the sides of your body. 3. Push down on the armrests and use your arms to lift yourself off of the chair. Straighten your elbows and lift yourself up as much as you comfortably can. ? Move your shoulder blades down, and avoid letting your shoulders move up toward your ears. ? Keep your feet on the ground. As you get stronger, your feet should support less of your body weight as you lift yourself up. 4. Hold for __________ seconds. 5. Slowly lower yourself back into the chair. Repeat __________ times. Complete this exercise __________ times a  day. Exercise L: Wall Push-Ups  1. Stand so you are facing a stable wall. Your feet should be about one arm-length away from the wall. 2. Lean forward and place your palms on the wall at shoulder height. 3. Keep your feet flat on the floor as you bend your elbows and lean forward toward the wall. 4. Hold for __________ seconds. 5. Straighten your elbows to push yourself back to the starting position. Repeat __________ times. Complete this exercise __________ times a day. This information is not intended to replace advice   given to you by your health care provider. Make sure you discuss any questions you have with your health care provider. Document Released: 11/18/2004 Document Revised: 09/29/2015 Document Reviewed: 09/15/2014 [employer] Interactive Patient Education  2018 [employer]  

## 2017-11-08 NOTE — Progress Notes (Signed)
Name: Jeffrey Schneider   MRN: [id number]    DOB: [birth year]   Date:11/08/2017       Progress Note  Subjective  Chief Complaint  Chief Complaint  Patient presents with  . Follow-up    6 mth f/u  . Knee Pain    majority his left knee with radiation that goes down and to the back    HPI  Pre-diabetes: we will recheck labs, last hgbA1C is 5.6%, he denies polyphagia, polydipsia or polyuria.   Goiter: thyroid US done in 2016 showed no nodules, mild enlargement, we will continue to monitor thyroid panel. He has lost some weight because of life style modifications. Denies change in bowel movements. Denies dysphagia.   Obesity: he is drinking more water and has been more basketball lately.   OA left knee and meniscal tear: he states occasionally has mild edema. Pain is chronic, dull aching , right now is a 2/10, after playing basketball pain is zero if he takes Advil prior to his game, however a few hours after playing pain goes up to 6/10 and left hamstring feels tight. Advised to avoid nsaid's and take tylenol, use topical biofreeze and ice it   Hyperlipidemia: we will recheck labs today, discussed increase fruit and vegetable intake and also fish. Good cholesterol was low.   Shoulder pain: he states he has some spasms and pain on right shoulder, intermittently, no tingling or numbness. He is playing basketball and weight training, discussed home activity  Patient Active Problem List   Diagnosis Date Noted  . Medial meniscus tear 11/08/2017  . Lateral meniscus tear 01/30/2016  . Bilateral knee pain 09/27/2014  . Dyslipidemia 09/24/2014  . Decreased libido 09/24/2014  . Goiter 09/24/2014  . Hemorrhoids, internal 09/24/2014  . Dysmetabolic syndrome 09/24/2014  . Obesity (BMI 30.0-34.9) 09/24/2014  . Osteoarthritis of left knee 09/24/2014  . Abnormal blood sugar 11/25/2008  . Cervical pain 11/17/2006    Past Surgical History:  Procedure Laterality Date  . ACHILLES TENDON  SURGERY Left    Past Rupture    Family History  Problem Relation Age of Onset  . Depression Mother   . Obesity Mother   . Alcohol abuse Father   . Cirrhosis Father   . Cirrhosis Sister   . Alcohol abuse Brother   . Allergic rhinitis Daughter   . Allergic rhinitis Son   . Cancer Maternal Grandmother        Colon    Social History   Socioeconomic History  . Marital status: Married    Spouse name: [name]  . Number of children: 2  . Years of education: Not on file  . Highest education level: Master's degree (e.g., MA, MS, MEng, MEd, MSW, MBA)  Occupational History  . Occupation: Associate Professor: [employer]  Social Needs  . Financial resource strain: Somewhat hard  . Food insecurity:    Worry: Never true    Inability: Never true  . Transportation needs:    Medical: No    Non-medical: No  Tobacco Use  . Smoking status: Never Smoker  . Smokeless tobacco: Never Used  Substance and Sexual Activity  . Alcohol use: No    Alcohol/week: 0.0 standard drinks  . Drug use: No  . Sexual activity: Yes    Partners: Female  Lifestyle  . Physical activity:    Days per week: 6 days    Minutes per session: 90 min  . Stress: Not at all  Relationships  .  Social connections:    Talks on phone: Twice a week    Gets together: Once a week    Attends religious service: More than 4 times per year    Active member of club or organization: Yes    Attends meetings of clubs or organizations: More than 4 times per year    Relationship status: Married  . Intimate partner violence:    Fear of current or ex partner: No    Emotionally abused: No    Physically abused: No    Forced sexual activity: No  Other Topics Concern  . 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  Ibuprofen (ADVIL) 200 MG CAPS, Advil, Disp: , Rfl:  .  Misc Natural  Products (OSTEO BI-FLEX ADV JOINT SHIELD PO), Take by mouth., Disp: , Rfl:  .  Multiple Vitamins-Minerals (ONE-A-DAY MENS HEALTH FORMULA PO), Take by mouth., Disp: , Rfl:   No Known Allergies  I personally reviewed active problem list, medication list, allergies, family history, social history with the patient/caregiver today.   ROS  Constitutional: Negative for fever, positive for weight change.  Respiratory: Negative for cough and shortness of breath.   Cardiovascular: Negative for chest pain or palpitations.  Gastrointestinal: Negative for abdominal pain, no bowel changes.  Musculoskeletal: Positive for gait problem but no  joint swelling. Right shoulder pain intermittently  Skin: Negative for rash.  Neurological: Negative for dizziness or headache.  No other specific complaints in a complete review of systems (except as listed in HPI above).   Objective  Vitals:   11/08/17 0756  BP: 118/70  Pulse: 81  Resp: 16  Temp: 97.9 F (36.6 C)  TempSrc: Oral  SpO2: 98%  Weight: 256 lb 4.8 oz (116.3 kg)  Height: 6\' 2"  (1.88 m)    Body mass index is 32.91 kg/m.  Physical Exam  Constitutional: Patient appears well-developed and well-nourished. Obese No distress.  HEENT: head atraumatic, normocephalic, pupils equal and reactive to light,  neck supple, throat within normal limits Cardiovascular: Normal rate, regular rhythm and normal heart sounds.  No murmur heard. No BLE edema. Pulmonary/Chest: Effort normal and breath sounds normal. No respiratory distress. Abdominal: Soft.  There is no tenderness. Muscular skeletal: crepitus with extension of both knees , no effusion or increase in warmth  Psychiatric: Patient has a normal mood and affect. behavior is normal. Judgment and thought content normal.  PHQ2/9: Depression screen [facility] 2/9 11/08/2017 08/11/2017 05/09/2017 04/29/2016 09/02/2015  Decreased Interest 0 0 0 0 0  Down, Depressed, Hopeless 0 0 0 0 0  PHQ - 2 Score 0 0 0 0 0   Altered sleeping 2 0 - - -  Tired, decreased energy 0 0 - - -  Change in appetite 0 0 - - -  Feeling bad or failure about yourself  0 0 - - -  Trouble concentrating 0 0 - - -  Moving slowly or fidgety/restless 0 0 - - -  Suicidal thoughts 0 0 - - -  PHQ-9 Score 2 0 - - -  Difficult doing work/chores - Not difficult at all - - -     Fall Risk: Fall Risk  11/08/2017 08/11/2017 05/09/2017 10/29/2016 04/29/2016  Falls in the past year? No No No No No  Number falls in past yr: - - - - -  Comment - - - - -  Injury with Fall? - - - - -     Functional Status Survey: Is the patient deaf or have difficulty hearing?: No Does the patient have difficulty seeing, even when wearing glasses/contacts?: No Does the patient have difficulty concentrating, remembering, or making decisions?: No Does the patient have difficulty walking or climbing stairs?: Yes(due to knee pain) Does the patient have difficulty dressing or bathing?: No Does the patient have difficulty doing errands alone such as visiting a doctor's office or shopping?: No    Assessment & Plan  1. Dyslipidemia  - Lipid panel  2. Dysmetabolic syndrome  - COMPLETE METABOLIC PANEL WITH GFR - Hemoglobin A1c  3. Goiter  - Thyroid Panel With TSH  4. Obesity (BMI 30.0-34.9)  Discussed with the patient the risk posed by an increased BMI. Discussed importance of portion control, calorie counting and at least 150 minutes of physical activity weekly. Avoid sweet beverages and drink more water. Eat at least 6 servings of fruit and vegetables daily   5. Long-term use of high-risk medication  Taking Advil, advised to stop Advil and take Tylenol prn for pain , ice knee and stretch before an after activity  - CBC with Differential/Platelet - COMPLETE METABOLIC PANEL WITH GFR  6. Tear of medial meniscus of knee, current, unspecified laterality, unspecified tear type, sequela  Seen by Ortho before, still plays basketball   7. Primary  osteoarthritis of left knee  Advised to avoid nsaid's    8. Chronic right shoulder pain  He wants to try home exercises

## 2017-11-16 DIAGNOSIS — Z79899 Other long term (current) drug therapy: Secondary | ICD-10-CM

## 2017-11-16 DIAGNOSIS — E049 Nontoxic goiter, unspecified: Secondary | ICD-10-CM

## 2017-11-16 DIAGNOSIS — E8881 Metabolic syndrome: Secondary | ICD-10-CM

## 2017-11-16 DIAGNOSIS — E785 Hyperlipidemia, unspecified: Secondary | ICD-10-CM

## 2017-11-16 DIAGNOSIS — E88819 Insulin resistance, unspecified: Secondary | ICD-10-CM

## 2017-11-17 LAB — CBC WITH DIFFERENTIAL/PLATELET
BASOS: 0 %
Basophils Absolute: 0 10*3/uL (ref 0.0–0.2)
EOS (ABSOLUTE): 0.1 10*3/uL (ref 0.0–0.4)
EOS: 2 %
Hematocrit: 40.7 % (ref 37.5–51.0)
Hemoglobin: 14.1 g/dL (ref 13.0–17.7)
IMMATURE GRANS (ABS): 0 10*3/uL (ref 0.0–0.1)
IMMATURE GRANULOCYTES: 0 %
Lymphocytes Absolute: 1.6 10*3/uL (ref 0.7–3.1)
Lymphs: 24 %
MCH: 29.3 pg (ref 26.6–33.0)
MCHC: 34.6 g/dL (ref 31.5–35.7)
MCV: 85 fL (ref 79–97)
Monocytes Absolute: 0.4 10*3/uL (ref 0.1–0.9)
Monocytes: 6 %
NEUTROS PCT: 68 %
Neutrophils Absolute: 4.5 10*3/uL (ref 1.4–7.0)
Platelets: 234 10*3/uL (ref 150–450)
RBC: 4.81 x10E6/uL (ref 4.14–5.80)
RDW: 13.1 % (ref 12.3–15.4)
WBC: 6.7 10*3/uL (ref 3.4–10.8)

## 2017-11-17 LAB — COMPREHENSIVE METABOLIC PANEL
A/G RATIO: 1.4 (ref 1.2–2.2)
ALT: 20 IU/L (ref 0–44)
AST: 20 IU/L (ref 0–40)
Albumin: 4.3 g/dL (ref 3.5–5.5)
Alkaline Phosphatase: 80 IU/L (ref 39–117)
BUN/Creatinine Ratio: 11 (ref 9–20)
BUN: 14 mg/dL (ref 6–24)
Bilirubin Total: 0.3 mg/dL (ref 0.0–1.2)
CALCIUM: 9.2 mg/dL (ref 8.7–10.2)
CO2: 19 mmol/L — AB (ref 20–29)
CREATININE: 1.25 mg/dL (ref 0.76–1.27)
Chloride: 108 mmol/L — ABNORMAL HIGH (ref 96–106)
GFR, EST AFRICAN AMERICAN: 80 mL/min/{1.73_m2} (ref >59)
GFR, EST NON AFRICAN AMERICAN: 69 mL/min/{1.73_m2} (ref >59)
Globulin, Total: 3 g/dL (ref 1.5–4.5)
Glucose: 101 mg/dL — ABNORMAL HIGH (ref 65–99)
POTASSIUM: 4.6 mmol/L (ref 3.5–5.2)
Sodium: 141 mmol/L (ref 134–144)
TOTAL PROTEIN: 7.3 g/dL (ref 6.0–8.5)

## 2017-11-17 LAB — LIPID PANEL
Chol/HDL Ratio: 4.4 ratio (ref 0.0–5.0)
Cholesterol, Total: 157 mg/dL (ref 100–199)
HDL: 36 mg/dL — AB (ref >39)
LDL Calculated: 99 mg/dL (ref 0–99)
Triglycerides: 108 mg/dL (ref 0–149)
VLDL Cholesterol Cal: 22 mg/dL (ref 5–40)

## 2017-11-17 LAB — TSH: TSH: 1.95 u[IU]/mL (ref 0.450–4.500)

## 2017-11-17 LAB — HGB A1C W/O EAG: Hgb A1c MFr Bld: 5.7 % — ABNORMAL HIGH (ref 4.8–5.6)

## 2017-11-18 ENCOUNTER — Encounter: Payer: Self-pay | Admitting: Family Medicine

## 2018-05-11 ENCOUNTER — Ambulatory Visit (INDEPENDENT_AMBULATORY_CARE_PROVIDER_SITE_OTHER): Payer: BLUE CROSS/BLUE SHIELD | Admitting: Family Medicine

## 2018-05-11 ENCOUNTER — Encounter: Payer: Self-pay | Admitting: Family Medicine

## 2018-05-11 ENCOUNTER — Other Ambulatory Visit: Payer: Self-pay

## 2018-05-11 VITALS — BP 124/80 | HR 90 | Temp 98.1°F | Resp 16 | Ht 74.0 in | Wt 269.2 lb

## 2018-05-11 DIAGNOSIS — Z1159 Encounter for screening for other viral diseases: Secondary | ICD-10-CM

## 2018-05-11 DIAGNOSIS — E8881 Metabolic syndrome: Secondary | ICD-10-CM | POA: Diagnosis not present

## 2018-05-11 DIAGNOSIS — K219 Gastro-esophageal reflux disease without esophagitis: Secondary | ICD-10-CM

## 2018-05-11 DIAGNOSIS — Z0000 Encounter for general adult medical examination without abnormal findings: Secondary | ICD-10-CM | POA: Diagnosis not present

## 2018-05-11 DIAGNOSIS — E785 Hyperlipidemia, unspecified: Secondary | ICD-10-CM | POA: Diagnosis not present

## 2018-05-11 DIAGNOSIS — E669 Obesity, unspecified: Secondary | ICD-10-CM

## 2018-05-11 DIAGNOSIS — E049 Nontoxic goiter, unspecified: Secondary | ICD-10-CM

## 2018-05-11 MED ORDER — FAMOTIDINE 40 MG PO TABS: 40.0000 mg | ORAL_TABLET | Freq: Every day | ORAL | 0 refills | Status: DC

## 2018-05-11 NOTE — Progress Notes (Signed)
Name: Jeffrey Schneider   MRN: [id number]    DOB: 05/29/1970   Date:05/11/2018       Progress Note  Subjective  Chief Complaint  Chief Complaint  Patient presents with  . Annual Exam    HPI  Patient presents for annual CPE and follow up.  Goiter: thyroid US done in 2016 showed no nodules, mild enlargement, we will continue to monitor thyroid panel. He has lost some weight because of life style modifications. Denies change in bowel movements.   GERD: he has a long history of GERD, however a little worse recently, he states he has gained weight and has noticed burning on his throat when he goes to bed. Discussed a GERD diet. Try Pepcid for one week and after that prn   Obesity: he has been working from home because of COVID-19, not as active and is frustrated about his weight gain, he states one month ago his weight was 259 lbs and has gained 10 lbs since  Hyperlipidemia: we will recheck labs today, discussed increase fruit and vegetable intake and also fish. Good cholesterol was low. He has not been very active, but he will resuem   USPSTF grade A and B recommendations:  Diet: snacking more often, eating home , occasionally gest fast food  Exercise: still active but not as active as he was prior to COVID-19, working from home  Depression: phq 9 is negative  Depression screen [facility] 2/9 05/11/2018 11/08/2017 08/11/2017 05/09/2017 04/29/2016  Decreased Interest 0 0 0 0 0  Down, Depressed, Hopeless 0 0 0 0 0  PHQ - 2 Score 0 0 0 0 0  Altered sleeping 0 2 0 - -  Tired, decreased energy 0 0 0 - -  Change in appetite 1 0 0 - -  Feeling bad or failure about yourself  0 0 0 - -  Trouble concentrating 0 0 0 - -  Moving slowly or fidgety/restless 0 0 0 - -  Suicidal thoughts 0 0 0 - -  PHQ-9 Score 1 2 0 - -  Difficult doing work/chores Not difficult at all - Not difficult at all - -    Hypertension:  BP Readings from Last 3 Encounters:  05/11/18 124/80  11/08/17 118/70  08/11/17  130/80    Obesity: Wt Readings from Last 3 Encounters:  05/11/18 269 lb 3.2 oz (122.1 kg)  11/08/17 256 lb 4.8 oz (116.3 kg)  08/11/17 263 lb 6.4 oz (119.5 kg)   BMI Readings from Last 3 Encounters:  05/11/18 34.56 kg/m  11/08/17 32.91 kg/m  08/11/17 33.82 kg/m     Lipids:  Lab Results  Component Value Date   CHOL 157 11/16/2017   CHOL 156 10/29/2016   CHOL 145 09/02/2015   Lab Results  Component Value Date   HDL 36 (L) 11/16/2017   HDL 43 10/29/2016   HDL 44 09/02/2015   Lab Results  Component Value Date   LDLCALC 99 11/16/2017   LDLCALC 97 10/29/2016   LDLCALC 86 09/02/2015   Lab Results  Component Value Date   TRIG 108 11/16/2017   TRIG 74 10/29/2016   TRIG 75 09/02/2015   Lab Results  Component Value Date   CHOLHDL 4.4 11/16/2017   CHOLHDL 3.6 10/29/2016   CHOLHDL 3.3 09/02/2015   No results found for: LDLDIRECT Glucose:  Glucose  Date Value Ref Range Status  11/16/2017 101 (H) 65 - 99 mg/dL Final   Glucose, Bld  Date Value Ref Range Status  10/29/2016 97 65 - 99 mg/dL Final    Comment:    .            Fasting reference interval .   09/02/2015 97 65 - 99 mg/dL Final      [facility] Visit from 05/11/2018 in [facility]  AUDIT-C Score  0      Married STD testing and prevention (HIV/chl/gon/syphilis): Not interested, he is married  Hep C: today   Skin cancer: discussed atypical lesions  Colorectal cancer:  Repeat at age 52  Prostate cancer: no family history   Lab Results  Component Value Date   PSA Normal, 0.5 02/21/2014    IPSS Questionnaire (AUA-7): Over the past month.   1)  How often have you had a sensation of not emptying your bladder completely after you finish urinating?  0 - Not at all  2)  How often have you had to urinate again less than two hours after you finished urinating? 0 - Not at all  3)  How often have you found you stopped and started again several times when you urinated?  1 - Less than 1  time in 5  4) How difficult have you found it to postpone urination?  0 - Not at all  5) How often have you had a weak urinary stream?  0 - Not at all  6) How often have you had to push or strain to begin urination?  0 - Not at all  7) How many times did you most typically get up to urinate from the time you went to bed until the time you got up in the morning?  1 - 1 time  Total score:  0-7 mildly symptomatic   8-19 moderately symptomatic   20-35 severely symptomatic    ECG:  We can recheck in the future, he has a baseline   Advanced Care Planning: A voluntary discussion about advance care planning including the explanation and discussion of advance directives.  Discussed health care proxy and Living will, and the patient was able to identify a health care proxy as wife.  Patient does not have a living will at present time. If patient does have living will, I have requested they bring this to the clinic to be scanned in to their chart.  Patient Active Problem List   Diagnosis Date Noted  . Medial meniscus tear 11/08/2017  . Lateral meniscus tear 01/30/2016  . Bilateral knee pain 09/27/2014  . Dyslipidemia 09/24/2014  . Decreased libido 09/24/2014  . Goiter 09/24/2014  . Hemorrhoids, internal 09/24/2014  . Dysmetabolic syndrome 09/24/2014  . Obesity (BMI 30.0-34.9) 09/24/2014  . Osteoarthritis of left knee 09/24/2014  . Abnormal blood sugar 11/25/2008  . Cervical pain 11/17/2006    Past Surgical History:  Procedure Laterality Date  . ACHILLES TENDON SURGERY Left    Past Rupture    Family History  Problem Relation Age of Onset  . Depression Mother   . Obesity Mother   . Alcohol abuse Father   . Cirrhosis Father   . Cirrhosis Sister   . Alcohol abuse Brother   . Allergic rhinitis Daughter   . Allergic rhinitis Son   . Cancer Maternal Grandmother        Colon    Social History   Socioeconomic History  . Marital status: Married    Spouse name: [name]  . Number of  children: 2  . Years of education: Not on file  . Highest education level:  Master's degree (e.g., MA, MS, MEng, MEd, MSW, MBA)  Occupational History  . Occupation: Associate Professor: [employer]  Social Needs  . Financial resource strain: Somewhat hard  . Food insecurity:    Worry: Never true    Inability: Never true  . Transportation needs:    Medical: No    Non-medical: No  Tobacco Use  . Smoking status: Never Smoker  . Smokeless tobacco: Never Used  Substance and Sexual Activity  . Alcohol use: No    Alcohol/week: 0.0 standard drinks  . Drug use: No  . Sexual activity: Yes    Partners: Female  Lifestyle  . Physical activity:    Days per week: 6 days    Minutes per session: 30 min  . Stress: Not at all  Relationships  . Social connections:    Talks on phone: Twice a week    Gets together: Once a week    Attends religious service: More than 4 times per year    Active member of club or organization: Yes    Attends meetings of clubs or organizations: More than 4 times per year    Relationship status: Married  . Intimate partner violence:    Fear of current or ex partner: No    Emotionally abused: No    Physically abused: No    Forced sexual activity: No  Other Topics Concern  . 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  Ibuprofen (ADVIL) 200 MG CAPS, Advil, Disp: , Rfl:  .  Misc Natural Products (OSTEO BI-FLEX ADV JOINT SHIELD PO), Take by mouth., Disp: , Rfl:  .  Multiple Vitamins-Minerals (ONE-A-DAY MENS HEALTH FORMULA PO), Take by mouth., Disp: , Rfl:   No Known Allergies   ROS  Constitutional: Negative for fever , positive for  weight change.  Respiratory: Negative for cough and shortness of breath.   Cardiovascular: Negative for chest pain ( GERD symptoms )  or palpitations.  Gastrointestinal: Negative for  abdominal pain, no bowel changes.  Musculoskeletal: Negative for gait problem or joint swelling.  Skin: Negative for rash.  Neurological: Negative for dizziness or headache.  No other specific complaints in a complete review of systems (except as listed in HPI above).   Objective  Vitals:   05/11/18 0812  BP: 124/80  Pulse: 90  Resp: 16  Temp: 98.1 F (36.7 C)  TempSrc: Oral  SpO2: 95%  Weight: 269 lb 3.2 oz (122.1 kg)  Height:  (1.88 m)    Body mass index is 34.56 kg/m.  Physical Exam  Constitutional: Patient appears well-developed and well-nourished. No distress.  HENT: Head: Normocephalic and atraumatic. Ears: B TMs ok, no erythema or effusion; Nose: Nose normal. Mouth/Throat: Oropharynx is clear and moist. No oropharyngeal exudate.  Eyes: Conjunctivae and EOM are normal. Pupils are equal, round, and reactive to light. No scleral icterus.  Neck: Normal range of motion. Neck supple. No JVD present. Mild  thyromegaly present.  Cardiovascular: Normal rate, regular rhythm and normal heart sounds.  No murmur heard. No BLE edema. Pulmonary/Chest: Effort normal and breath sounds normal. No respiratory distress. Abdominal: Soft. Bowel sounds are normal, no distension. There is no tenderness. no masses MALE GENITALIA: Normal descended testes bilaterally, no masses palpated, no hernias, no lesions, no discharge RECTAL: Prostate exam  not done Musculoskeletal: Normal range of motion, no joint effusions. No gross deformities Neurological: he is alert and oriented to person, place, and time. No cranial nerve deficit. Coordination, balance, strength, speech and gait are normal.  Skin: Skin is warm and dry. No rash noted. No erythema.  Psychiatric: Patient has a normal mood and affect. behavior is normal. Judgment and thought content normal.   PHQ2/9: [facility] 05/11/2018 11/08/2017 08/11/2017 05/09/2017 04/29/2016  Decreased Interest 0 0 0 0 0  Down, Depressed,  Hopeless 0 0 0 0 0  PHQ - 2 Score 0 0 0 0 0  Altered sleeping 0 2 0 - -  Tired, decreased energy 0 0 0 - -  Change in appetite 1 0 0 - -  Feeling bad or failure about yourself  0 0 0 - -  Trouble concentrating 0 0 0 - -  Moving slowly or fidgety/restless 0 0 0 - -  Suicidal thoughts 0 0 0 - -  PHQ-9 Score 1 2 0 - -  Difficult doing work/chores Not difficult at all - Not difficult at all - -     Fall Risk: Fall Risk  05/11/2018 11/08/2017 08/11/2017 05/09/2017 10/29/2016  Falls in the past year? 0 No No No No  Number falls in past yr: 0 - - - -  Comment - - - - -  Injury with Fall? 0 - - - -  Follow up Falls evaluation completed - - - -     Assessment & Plan  1. Encounter for routine history and physical exam for male  - CBC with Differential/Platelet  2. Dysmetabolic syndrome  - COMPLETE METABOLIC PANEL WITH GFR - Hemoglobin A1c  3. Dyslipidemia  - Lipid panel  4. Obesity (BMI 30.0-34.9)  Discussed with the patient the risk posed by an increased BMI. Discussed importance of portion control, calorie counting and at least 150 minutes of physical activity weekly. Avoid sweet beverages and drink more water. Eat at least 6 servings of fruit and vegetables daily   5. GERD without esophagitis  - famotidine (PEPCID) 40 MG tablet; Take 1 tablet (40 mg total) by mouth daily.  Dispense: 30 tablet; Refill: 0   -Prostate cancer screening and PSA options (with potential risks and benefits of testing vs not testing) were discussed along with recent recs/guidelines. -USPSTF grade A and B recommendations reviewed with patient; age-appropriate recommendations, preventive care, screening tests, etc discussed and encouraged; healthy living encouraged; see AVS for patient education given to patient -Discussed importance of 150 minutes of physical activity weekly, eat two servings of fish weekly, eat one serving of tree nuts ( cashews, pistachios, pecans, almonds.Marland Kitchen.) every other day, eat 6  servings of fruit/vegetables daily and drink plenty of water and avoid sweet beverages.

## 2018-05-11 NOTE — Patient Instructions (Signed)
Preventive Care 40-64 Years, Male Preventive care refers to lifestyle choices and visits with your health care provider that can promote health and wellness. What does preventive care include?   A yearly physical exam. This is also called an annual well check.  Dental exams once or twice a year.  Routine eye exams. Ask your health care provider how often you should have your eyes checked.  Personal lifestyle choices, including: ? Daily care of your teeth and gums. ? Regular physical activity. ? Eating a healthy diet. ? Avoiding tobacco and drug use. ? Limiting alcohol use. ? Practicing safe sex. ? Taking low-dose aspirin every day starting at age 46. What happens during an annual well check? The services and screenings done by your health care provider during your annual well check will depend on your age, overall health, lifestyle risk factors, and family history of disease. Counseling Your health care provider may ask you questions about your:  Alcohol use.  Tobacco use.  Drug use.  Emotional well-being.  Home and relationship well-being.  Sexual activity.  Eating habits.  Work and work environment. Screening You may have the following tests or measurements:  Height, weight, and BMI.  Blood pressure.  Lipid and cholesterol levels. These may be checked every 5 years, or more frequently if you are over 46 years old.  Skin check.  Lung cancer screening. You may have this screening every year starting at age 46 if you have a 30-pack-year history of smoking and currently smoke or have quit within the past 15 years.  Colorectal cancer screening. All adults should have this screening starting at age 46 and continuing until age 46. Your health care provider may recommend screening at age 45. You will have tests every 1-10 years, depending on your results and the type of screening test. People at increased risk should start screening at an earlier age. Screening tests may  include: ? Guaiac-based fecal occult blood testing. ? Fecal immunochemical test (FIT). ? Stool DNA test. ? Virtual colonoscopy. ? Sigmoidoscopy. During this test, a flexible tube with a tiny camera (sigmoidoscope) is used to examine your rectum and lower colon. The sigmoidoscope is inserted through your anus into your rectum and lower colon. ? Colonoscopy. During this test, a long, thin, flexible tube with a tiny camera (colonoscope) is used to examine your entire colon and rectum.  Prostate cancer screening. Recommendations will vary depending on your family history and other risks.  Hepatitis C blood test.  Hepatitis B blood test.  Sexually transmitted disease (STD) testing.  Diabetes screening. This is done by checking your blood sugar (glucose) after you have not eaten for a while (fasting). You may have this done every 1-3 years. Discuss your test results, treatment options, and if necessary, the need for more tests with your health care provider. Vaccines Your health care provider may recommend certain vaccines, such as:  Influenza vaccine. This is recommended every year.  Tetanus, diphtheria, and acellular pertussis (Tdap, Td) vaccine. You may need a Td booster every 10 years.  Varicella vaccine. You may need this if you have not been vaccinated.  Zoster vaccine. You may need this after age 46.  Measles, mumps, and rubella (MMR) vaccine. You may need at least one dose of MMR if you were born in 1957 or later. You may also need a second dose.  Pneumococcal 13-valent conjugate (PCV13) vaccine. You may need this if you have certain conditions and have not been vaccinated.  Pneumococcal polysaccharide (PPSV23) vaccine.   You may need one or two doses if you smoke cigarettes or if you have certain conditions.  Meningococcal vaccine. You may need this if you have certain conditions.  Hepatitis A vaccine. You may need this if you have certain conditions or if you travel or work in  places where you may be exposed to hepatitis A.  Hepatitis B vaccine. You may need this if you have certain conditions or if you travel or work in places where you may be exposed to hepatitis B.  Haemophilus influenzae type b (Hib) vaccine. You may need this if you have certain risk factors. Talk to your health care provider about which screenings and vaccines you need and how often you need them. This information is not intended to replace advice given to you by your health care provider. Make sure you discuss any questions you have with your health care provider. Document Released: 01/31/2015 Document Revised: 02/24/2017 Document Reviewed: 11/05/2014 Elsevier Interactive Patient Education  2019 [employer]  

## 2018-06-02 ENCOUNTER — Other Ambulatory Visit: Payer: Self-pay | Admitting: Family Medicine

## 2018-06-02 DIAGNOSIS — K219 Gastro-esophageal reflux disease without esophagitis: Secondary | ICD-10-CM

## 2018-06-02 NOTE — Telephone Encounter (Signed)
Refill request for general medication: Pepcid 40 mg  Last office visit: 05/11/2018   Follow-ups on file. 11/10/2018

## 2018-06-21 ENCOUNTER — Other Ambulatory Visit: Payer: Self-pay

## 2018-06-21 DIAGNOSIS — E8881 Metabolic syndrome: Secondary | ICD-10-CM

## 2018-06-21 DIAGNOSIS — Z0000 Encounter for general adult medical examination without abnormal findings: Secondary | ICD-10-CM

## 2018-06-21 DIAGNOSIS — E049 Nontoxic goiter, unspecified: Secondary | ICD-10-CM

## 2018-06-21 DIAGNOSIS — E785 Hyperlipidemia, unspecified: Secondary | ICD-10-CM

## 2018-06-21 DIAGNOSIS — Z1159 Encounter for screening for other viral diseases: Secondary | ICD-10-CM

## 2018-06-22 LAB — THYROID PANEL WITH TSH
Free Thyroxine Index: 1.7 (ref 1.2–4.9)
T3 Uptake Ratio: 28 % (ref 24–39)
T4, Total: 6 ug/dL (ref 4.5–12.0)
TSH: 2.27 u[IU]/mL (ref 0.450–4.500)

## 2018-06-22 LAB — COMPREHENSIVE METABOLIC PANEL
ALT: 26 IU/L (ref 0–44)
AST: 16 IU/L (ref 0–40)
Albumin/Globulin Ratio: 1.6 (ref 1.2–2.2)
Albumin: 4.4 g/dL (ref 4.0–5.0)
Alkaline Phosphatase: 81 IU/L (ref 39–117)
BUN/Creatinine Ratio: 15 (ref 9–20)
BUN: 17 mg/dL (ref 6–24)
Bilirubin Total: 0.3 mg/dL (ref 0.0–1.2)
CO2: 17 mmol/L — ABNORMAL LOW (ref 20–29)
Calcium: 9.7 mg/dL (ref 8.7–10.2)
Chloride: 104 mmol/L (ref 96–106)
Creatinine, Ser: 1.13 mg/dL (ref 0.76–1.27)
GFR calc Af Amer: 90 mL/min/{1.73_m2} (ref >59)
GFR calc non Af Amer: 78 mL/min/{1.73_m2} (ref >59)
Globulin, Total: 2.7 g/dL (ref 1.5–4.5)
Glucose: 120 mg/dL — ABNORMAL HIGH (ref 65–99)
Potassium: 4.5 mmol/L (ref 3.5–5.2)
Sodium: 140 mmol/L (ref 134–144)
Total Protein: 7.1 g/dL (ref 6.0–8.5)

## 2018-06-22 LAB — CBC WITH DIFFERENTIAL/PLATELET
Basophils Absolute: 0 10*3/uL (ref 0.0–0.2)
Basos: 0 %
EOS (ABSOLUTE): 0.1 10*3/uL (ref 0.0–0.4)
Eos: 2 %
Hematocrit: 42.4 % (ref 37.5–51.0)
Hemoglobin: 14.3 g/dL (ref 13.0–17.7)
Immature Grans (Abs): 0 10*3/uL (ref 0.0–0.1)
Immature Granulocytes: 0 %
Lymphocytes Absolute: 1.8 10*3/uL (ref 0.7–3.1)
Lymphs: 28 %
MCH: 29.1 pg (ref 26.6–33.0)
MCHC: 33.7 g/dL (ref 31.5–35.7)
MCV: 86 fL (ref 79–97)
Monocytes Absolute: 0.4 10*3/uL (ref 0.1–0.9)
Monocytes: 6 %
Neutrophils Absolute: 4.2 10*3/uL (ref 1.4–7.0)
Neutrophils: 64 %
Platelets: 216 10*3/uL (ref 150–450)
RBC: 4.91 x10E6/uL (ref 4.14–5.80)
RDW: 13.9 % (ref 11.6–15.4)
WBC: 6.5 10*3/uL (ref 3.4–10.8)

## 2018-06-22 LAB — LIPID PANEL
Chol/HDL Ratio: 5.6 ratio — ABNORMAL HIGH (ref 0.0–5.0)
Cholesterol, Total: 174 mg/dL (ref 100–199)
HDL: 31 mg/dL — ABNORMAL LOW (ref >39)
LDL Calculated: 99 mg/dL (ref 0–99)
Triglycerides: 221 mg/dL — ABNORMAL HIGH (ref 0–149)
VLDL Cholesterol Cal: 44 mg/dL — ABNORMAL HIGH (ref 5–40)

## 2018-06-22 LAB — HEPATITIS C ANTIBODY: Hep C Virus Ab: <0.1 s/co ratio (ref 0.0–0.9)

## 2018-06-22 LAB — HGB A1C W/O EAG: Hgb A1c MFr Bld: 6.3 % — ABNORMAL HIGH (ref 4.8–5.6)

## 2018-11-10 ENCOUNTER — Other Ambulatory Visit: Payer: Self-pay

## 2018-11-10 ENCOUNTER — Encounter: Payer: Self-pay | Admitting: Family Medicine

## 2018-11-10 ENCOUNTER — Ambulatory Visit: Payer: BLUE CROSS/BLUE SHIELD | Admitting: Family Medicine

## 2018-11-10 VITALS — BP 126/80 | HR 86 | Temp 96.9°F | Resp 16 | Ht 74.0 in | Wt 275.7 lb

## 2018-11-10 DIAGNOSIS — E049 Nontoxic goiter, unspecified: Secondary | ICD-10-CM

## 2018-11-10 DIAGNOSIS — E785 Hyperlipidemia, unspecified: Secondary | ICD-10-CM | POA: Diagnosis not present

## 2018-11-10 DIAGNOSIS — F341 Dysthymic disorder: Secondary | ICD-10-CM

## 2018-11-10 DIAGNOSIS — Z23 Encounter for immunization: Secondary | ICD-10-CM | POA: Diagnosis not present

## 2018-11-10 DIAGNOSIS — E8881 Metabolic syndrome: Secondary | ICD-10-CM | POA: Diagnosis not present

## 2018-11-10 DIAGNOSIS — K219 Gastro-esophageal reflux disease without esophagitis: Secondary | ICD-10-CM

## 2018-11-10 NOTE — Progress Notes (Signed)
Name: Jeffrey Schneider   MRN: [id number]    DOB: 1970/07/20   Date:11/10/2018       Progress Note  Subjective  Chief Complaint  Chief Complaint  Patient presents with  . Follow-up    6 month F/U  . Gastroesophageal Reflux    Takes medication as needed    HPI  Goiter: thyroid US done in 2016 showed no nodules, mild enlargement, we will continue to monitor thyroid panel. . Denies change in bowel movements. Last labs were nor   GERD: he has a long history of GERD, since last visit he states he is doing better, taking Pepcid prn   Obesity: he has been working from home because of COVID-19, not as active and is frustrated about his weight gain, but not physically active, gained about 5 lbs since last visit, having lack of libido.   Hyperlipidemia: discussed increase fruit and vegetable intake and also fish. Good cholesterol was low.He has not been active, discussed importance of healthy diet and physical activity   Dysthymia: phq 9 is high, he is very worried about his son, he was going to [facility] but he withdrew because unable to stay motivated. He states feeling tired, not exercising. Feeling bad about himself, change in appetite. Discussed medication but he wants to hold off. He feels guilty about his son not doing well.    Patient Active Problem List   Diagnosis Date Noted  . Medial meniscus tear 11/08/2017  . Lateral meniscus tear 01/30/2016  . Bilateral knee pain 09/27/2014  . Dyslipidemia 09/24/2014  . Decreased libido 09/24/2014  . Goiter 09/24/2014  . Hemorrhoids, internal 09/24/2014  . Dysmetabolic syndrome 09/24/2014  . Obesity (BMI 30.0-34.9) 09/24/2014  . Osteoarthritis of left knee 09/24/2014  . Abnormal blood sugar 11/25/2008  . Cervical pain 11/17/2006    Past Surgical History:  Procedure Laterality Date  . ACHILLES TENDON SURGERY Left    Past Rupture    Family History  Problem Relation Age of Onset  . Depression Mother   . Obesity Mother    . Alcohol abuse Father   . Cirrhosis Father   . Cirrhosis Sister   . Alcohol abuse Brother   . Allergic rhinitis Daughter   . Allergic rhinitis Son   . Cancer Maternal Grandmother        Colon    Social History   Socioeconomic History  . Marital status: Married    Spouse name: [name]  . Number of children: 2  . Years of education: Not on file  . Highest education level: Master's degree (e.g., MA, MS, MEng, MEd, MSW, MBA)  Occupational History  . Occupation: Associate Professor: [employer]  Social Needs  . Financial resource strain: Somewhat hard  . Food insecurity    Worry: Never true    Inability: Never true  . Transportation needs    Medical: No    Non-medical: No  Tobacco Use  . Smoking status: Never Smoker  . Smokeless tobacco: Never Used  Substance and Sexual Activity  . Alcohol use: No    Alcohol/week: 0.0 standard drinks  . Drug use: No  . Sexual activity: Yes    Partners: Female  Lifestyle  . Physical activity    Days per week: 0 days    Minutes per session: 0 min  . Stress: Not at all  Relationships  . Social connections    Talks on phone: Twice a week    Gets together: Once a  week    Attends religious service: More than 4 times per year    Active member of club or organization: Yes    Attends meetings of clubs or organizations: More than 4 times per year    Relationship status: Married  . Intimate partner violence    Fear of current or ex partner: No    Emotionally abused: No    Physically abused: No    Forced sexual activity: No  Other Topics Concern  . Not on file  Social History Narrative   Married   Works at [employer] as a Scientist, research (physical sciences)   Two children, still at home ( teenagers )      Current Outpatient Medications:  .  famotidine (PEPCID) 40 MG tablet, Take 1 tablet (40 mg total) by mouth daily., Disp: 90 tablet, Rfl: 1 .  Multiple Vitamins-Minerals (ONE-A-DAY MENS HEALTH FORMULA PO), Take by mouth., Disp: , Rfl:  .   Ibuprofen (ADVIL) 200 MG CAPS, Advil, Disp: , Rfl:  .  Misc Natural Products (OSTEO BI-FLEX ADV JOINT SHIELD PO), Take by mouth., Disp: , Rfl:   No Known Allergies  I personally reviewed active problem list, medication list, allergies, family history, social history, health maintenance with the patient/caregiver today.   ROS  Constitutional: Negative for fever , positive for weight change.  Respiratory: Negative for cough and shortness of breath.   Cardiovascular: Negative for chest pain or palpitations.  Gastrointestinal: Negative for abdominal pain, no bowel changes.  Musculoskeletal: Negative for gait problem or joint swelling.  Skin: Negative for rash.  Neurological: Negative for dizziness or headache.  No other specific complaints in a complete review of systems (except as listed in HPI above).  Objective  Vitals:   11/10/18 0838  BP: 126/80  Pulse: 86  Resp: 16  Temp: (!) 96.9 F (36.1 C)  TempSrc: Temporal  SpO2: 97%  Weight: 275 lb 11.2 oz (125.1 kg)  Height: 6\' 2"  (1.88 m)    Body mass index is 35.4 kg/m.  Physical Exam  Constitutional: Patient appears well-developed and well-nourished. Obese No distress.  HEENT: head atraumatic, normocephalic, pupils equal and reactive to light Cardiovascular: Normal rate, regular rhythm and normal heart sounds.  No murmur heard. No BLE edema. Pulmonary/Chest: Effort normal and breath sounds normal. No respiratory distress. Abdominal: Soft.  There is no tenderness. Psychiatric: Patient has a normal mood and affect. behavior is normal. Judgment and thought content normal.  PHQ2/9: Depression screen [facility] 2/9 11/10/2018 05/11/2018 11/08/2017 08/11/2017 05/09/2017  Decreased Interest 3 0 0 0 0  Down, Depressed, Hopeless 1 0 0 0 0  PHQ - 2 Score 4 0 0 0 0  Altered sleeping 1 0 2 0 -  Tired, decreased energy 3 0 0 0 -  Change in appetite 1 1 0 0 -  Feeling bad or failure about yourself  2 0 0 0 -  Trouble concentrating 1 0 0 0 -   Moving slowly or fidgety/restless 0 0 0 0 -  Suicidal thoughts 0 0 0 0 -  PHQ-9 Score 12 1 2  0 -  Difficult doing work/chores Not difficult at all Not difficult at all - Not difficult at all -    phq 9 is positive  Fall Risk: Fall Risk  11/10/2018 05/11/2018 11/08/2017 08/11/2017 05/09/2017  Falls in the past year? 0 0 No No No  Number falls in past yr: 0 0 - - -  Comment - - - - -  Injury with Fall? 0  0 - - -  Follow up - Falls evaluation completed - - -     Functional Status Survey: Is the patient deaf or have difficulty hearing?: No Does the patient have difficulty seeing, even when wearing glasses/contacts?: Yes Does the patient have difficulty concentrating, remembering, or making decisions?: No Does the patient have difficulty walking or climbing stairs?: No Does the patient have difficulty dressing or bathing?: No Does the patient have difficulty doing errands alone such as visiting a doctor's office or shopping?: No    Assessment & Plan   1. Dysthymia  Refuses medication , discussed counseling   2. Need for immunization against influenza  - Flu Vaccine QUAD 36+ mos IM  3. Dysmetabolic syndrome  Last A1C 6.3%, discussed importance of changing in diet   4. Dyslipidemia  Discussed healthier diet and exercise   5. GERD without esophagitis   6. Goiter  Last thyroid panel was normal

## 2019-02-20 ENCOUNTER — Encounter: Payer: Self-pay | Admitting: Family Medicine

## 2019-02-20 ENCOUNTER — Other Ambulatory Visit: Payer: Self-pay

## 2019-02-20 ENCOUNTER — Ambulatory Visit: Payer: Self-pay | Admitting: *Deleted

## 2019-02-20 DIAGNOSIS — Z20822 Contact with and (suspected) exposure to covid-19: Secondary | ICD-10-CM

## 2019-02-20 LAB — POC COVID19 BINAXNOW: SARS Coronavirus 2 Ag: POSITIVE — AB

## 2019-02-21 ENCOUNTER — Other Ambulatory Visit: Payer: Self-pay

## 2019-02-21 ENCOUNTER — Encounter: Payer: Self-pay | Admitting: Family Medicine

## 2019-02-21 ENCOUNTER — Ambulatory Visit (INDEPENDENT_AMBULATORY_CARE_PROVIDER_SITE_OTHER): Payer: BC Managed Care – PPO | Admitting: Family Medicine

## 2019-02-21 DIAGNOSIS — U071 COVID-19: Secondary | ICD-10-CM | POA: Diagnosis not present

## 2019-02-21 MED ORDER — BENZONATATE 100 MG PO CAPS: 100.0000 mg | ORAL_CAPSULE | Freq: Three times a day (TID) | ORAL | 0 refills | Status: DC | PRN

## 2019-02-21 MED ORDER — GUAIFENESIN ER 600 MG PO TB12: 600.0000 mg | ORAL_TABLET | Freq: Two times a day (BID) | ORAL | 0 refills | Status: DC

## 2019-02-21 NOTE — Progress Notes (Signed)
Name: Jeffrey Schneider   MRN: [id number]    DOB: November 25, 1970   Date:02/21/2019       Progress Note  Subjective  Chief Complaint  Chief Complaint  Patient presents with  . Covid Positive    I connected with  Jeffrey Schneider  on 02/21/19 at 10:00 AM EST by a video enabled telemedicine application and verified that I am speaking with the correct person using two identifiers.  I discussed the limitations of evaluation and management by telemedicine and the availability of in person appointments. The patient expressed understanding and agreed to proceed. Staff also discussed with the patient that there may be a patient responsible charge related to this service. Patient Location: Home Provider Location: home office Additional Individuals present: none  HPI  Pt presents with concern for COVID-19 infection and subsequent symptoms.  He reached out to his PCP Dr. Carlynn Schneider yesterday who did recommend Vitamin D3 2000IU daily and zinc lozenges.  He notes symptoms started on 02/17/19; had exposure prior to this and had test 02/15/19 which was negative, was re-tested after symptoms started and was positive yesterday.  Cough started yesterday, still having body aches and fatigue, also having some mild chest congestion, subjective fever.  Denies chills, loss of smell/taste, shortness of breath, chest pain.  Patient Active Problem List   Diagnosis Date Noted  . Medial meniscus tear 11/08/2017  . Lateral meniscus tear 01/30/2016  . Bilateral knee pain 09/27/2014  . Dyslipidemia 09/24/2014  . Decreased libido 09/24/2014  . Goiter 09/24/2014  . Hemorrhoids, internal 09/24/2014  . Dysmetabolic syndrome 09/24/2014  . Obesity (BMI 30.0-34.9) 09/24/2014  . Osteoarthritis of left knee 09/24/2014  . Abnormal blood sugar 11/25/2008  . Cervical pain 11/17/2006    Social History   Tobacco Use  . Smoking status: Never Smoker  . Smokeless tobacco: Never Used  Substance Use Topics  . Alcohol use: No   Alcohol/week: 0.0 standard drinks     Current Outpatient Medications:  .  famotidine (PEPCID) 40 MG tablet, Take 1 tablet (40 mg total) by mouth daily., Disp: 90 tablet, Rfl: 1 .  Ibuprofen (ADVIL) 200 MG CAPS, Advil, Disp: , Rfl:  .  Misc Natural Products (OSTEO BI-FLEX ADV JOINT SHIELD PO), Take by mouth., Disp: , Rfl:  .  Multiple Vitamins-Minerals (ONE-A-DAY MENS HEALTH FORMULA PO), Take by mouth., Disp: , Rfl:   No Known Allergies  I personally reviewed active problem list, medication list, allergies, notes from last encounter, lab results with the patient/caregiver today.  ROS  Ten systems reviewed and is negative except as mentioned in HPI  Objective  Virtual encounter, vitals not obtained.  There is no height or weight on file to calculate BMI.  Nursing Note and Vital Signs reviewed.  Physical Exam  Constitutional: Patient appears well-developed and well-nourished. No distress.  HENT: Head: Normocephalic and atraumatic.  Neck: Normal range of motion. Pulmonary/Chest: Effort normal. No respiratory distress. Speaking in complete sentences Neurological: Pt is alert and oriented to person, place, and time. Coordination, speech and gait are normal.  Psychiatric: Patient has a normal mood and affect. behavior is normal. Judgment and thought content normal.  Results for orders placed or performed in visit on 02/20/19 (from the past 72 hour(s))  POC COVID-19 BinaxNow     Status: Abnormal   Collection Time: 02/20/19  3:50 PM  Result Value Ref Range   SARS Coronavirus 2 Ag Positive (A) Negative    Comment: Pt notified, he will notify his PCP  Sowles today for further guidance. Confidential Communicable Disease Report sent to [facility] via fax [phone].    Assessment & Plan  1. COVID-19 virus infection - Discussed isolation from other family members in his home, isolation at home until cleared by health dept, symptom management, getting plenty of rest, pushing fluids. -  guaiFENesin (MUCINEX) 600 MG 12 hr tablet; Take 1 tablet (600 mg total) by mouth 2 (two) times daily.  Dispense: 20 tablet; Refill: 0 - benzonatate (TESSALON PERLES) 100 MG capsule; Take 1 capsule (100 mg total) by mouth 3 (three) times daily as needed.  Dispense: 20 capsule; Refill: 0 - MyChart COVID-19 home monitoring program; Future - Temperature monitoring; Future   -Red flags and when to present for emergency care or RTC including fever >101.33F, chest pain, shortness of breath, new/worsening/un-resolving symptoms, reviewed with patient at time of visit. Follow up and care instructions discussed and provided in AVS. - I discussed the assessment and treatment plan with the patient. The patient was provided an opportunity to ask questions and all were answered. The patient agreed with the plan and demonstrated an understanding of the instructions.  I provided 18 minutes of non-face-to-face time during this encounter.  Schneider Hartshorn, FNP

## 2019-02-22 ENCOUNTER — Telehealth: Payer: Self-pay | Admitting: *Deleted

## 2019-02-22 ENCOUNTER — Encounter (INDEPENDENT_AMBULATORY_CARE_PROVIDER_SITE_OTHER): Payer: Self-pay

## 2019-02-22 ENCOUNTER — Ambulatory Visit: Payer: BC Managed Care – PPO | Admitting: Family Medicine

## 2019-02-22 NOTE — Telephone Encounter (Signed)
Attempted to contact pt due to MyChart Companion response of new diarrhea; the pt state he had 1 episode on 2/3 and 1 episode this morning; he described them as "watery"; recommendations given to pt: * Drink more fluids, at least 8-10 cups daily. One cup equals 8 oz (240 ml).  * WATER: Even for severe diarrhea, water is often the best liquid to drink. You should also eat some salty foods (e.g., potato chips, pretzels, saltine crackers). This is important to make sure you are getting enough salt, sugars, and fluids to meet your body's needs.  * SPORTS DRINKS: You can also drink a sports drink (e.g., Gatorade, Powerade) to help treat and prevent dehydration. For it to work best, mix it half and half with water.  * Avoid caffeinated beverages (Reason: caffeine is mildly dehydrating).  * Avoid alcohol beverages (beer, wine, hard liquor).    3: FOOD AND NUTRITION DURING SEVERE DIARRHEA:  * Drinking enough liquids is more important that eating when one has severe diarrhea.  * As the diarrhea starts to get better, you can slowly return to a normal diet.  * Begin with boiled starches / cereals (e.g., potatoes, rice, noodles, wheat, oats) with a small amount of salt to taste.  * Other foods that are OK include: bananas, yogurt, crackers, soup.   4: DIARRHEA MEDICINE - Loperamide (Imodium AD):  * This medicine helps decrease diarrhea. It is available over-the-counter (OTC) in a drug store.  * Adult dosage: 4 mg (2 capsules) is the recommended first dose. You may take an additional 2 mg (1 capsule) after each loose BM.  * Maximum dosage: 16 mg per day (8 capsules).  * Do not use for more than 2 days.   5: CAUTION - Loperamide (Imodium AD):  * DO NOT use if there is a fever over 100.4 F (38.0 C) or if there is blood or mucus in the stools.  * Read and follow the package instructions carefully.  He verbalized understanding; the pt sees Dr Carlynn Purl at [facility]; will route to provider for  notification.

## 2019-02-23 ENCOUNTER — Encounter: Payer: Self-pay | Admitting: Family Medicine

## 2019-02-23 ENCOUNTER — Encounter (INDEPENDENT_AMBULATORY_CARE_PROVIDER_SITE_OTHER): Payer: Self-pay

## 2019-02-24 ENCOUNTER — Encounter (INDEPENDENT_AMBULATORY_CARE_PROVIDER_SITE_OTHER): Payer: Self-pay

## 2019-02-24 ENCOUNTER — Telehealth: Payer: Self-pay

## 2019-02-24 NOTE — Telephone Encounter (Signed)
Vomiting after taking multivitamin with yellow emesis. Denies abdominal pain. Pt stated yesterday he is eating and drinking but had 1 episode of emesis. Pt encourages to drink water and or Gatorade as tolerated and to work his way to bland foods like apple sauce pretzels and crackers, soup and boiled starches Advised pt is unable to tolerate and food or liquids to call PCP or go to UC. Advised pt that if he develops severe vomiting (more than 6 times a day and /or > 8 hours and /or having severe abdominal pain to call 911 and seek tx in the Ed. Pt verbalized understanding.

## 2019-02-25 ENCOUNTER — Encounter: Payer: Self-pay | Admitting: Family Medicine

## 2019-02-25 ENCOUNTER — Encounter (INDEPENDENT_AMBULATORY_CARE_PROVIDER_SITE_OTHER): Payer: Self-pay

## 2019-02-26 ENCOUNTER — Encounter (INDEPENDENT_AMBULATORY_CARE_PROVIDER_SITE_OTHER): Payer: Self-pay

## 2019-02-26 ENCOUNTER — Ambulatory Visit: Payer: Self-pay | Admitting: *Deleted

## 2019-02-26 ENCOUNTER — Encounter: Payer: Self-pay | Admitting: Family Medicine

## 2019-02-26 ENCOUNTER — Ambulatory Visit (INDEPENDENT_AMBULATORY_CARE_PROVIDER_SITE_OTHER): Payer: BC Managed Care – PPO | Admitting: Family Medicine

## 2019-02-26 ENCOUNTER — Telehealth: Payer: Self-pay

## 2019-02-26 ENCOUNTER — Other Ambulatory Visit: Payer: Self-pay

## 2019-02-26 DIAGNOSIS — R05 Cough: Secondary | ICD-10-CM

## 2019-02-26 DIAGNOSIS — R058 Other specified cough: Secondary | ICD-10-CM

## 2019-02-26 DIAGNOSIS — U071 COVID-19: Secondary | ICD-10-CM | POA: Diagnosis not present

## 2019-02-26 DIAGNOSIS — R509 Fever, unspecified: Secondary | ICD-10-CM | POA: Diagnosis not present

## 2019-02-26 DIAGNOSIS — J989 Respiratory disorder, unspecified: Secondary | ICD-10-CM

## 2019-02-26 MED ORDER — PROMETHAZINE-DM 6.25-15 MG/5ML PO SYRP: 5.0000 mL | ORAL_SOLUTION | Freq: Four times a day (QID) | ORAL | 0 refills | Status: DC | PRN

## 2019-02-26 MED ORDER — AZITHROMYCIN 250 MG PO TABS: ORAL_TABLET | ORAL | 0 refills | Status: DC

## 2019-02-26 NOTE — Telephone Encounter (Signed)
He called in returning a call pertaining to his MyChart questionnaire. "I see now".    "The person that called sent me a message".  "It answered my questions" "I answered I was vomiting and she sent me information what to do".  I made sure he did not have any other questions and understood the directions.   He replied,   "I understand".    "Thank you anyway".  I let him know to call back if he had any other questions.

## 2019-02-26 NOTE — Telephone Encounter (Signed)
Tried to contact patient no answer and vm was left for patient to callback regarding mychart questionnaire. Will also send a my chart message

## 2019-02-26 NOTE — Progress Notes (Signed)
Name: Jeffrey Schneider   MRN: [id number]    DOB: 1970-02-18   Date:02/26/2019       Progress Note  Subjective  Chief Complaint  Chief Complaint  Patient presents with  . Cough  . Fever    101.00 today   . Covid Positive    Day #7    I connected with  Jeffrey Schneider  on 02/26/19 at 12:20 PM EST by a video enabled telemedicine application and verified that I am speaking with the correct person using two identifiers.  I discussed the limitations of evaluation and management by telemedicine and the availability of in person appointments. The patient expressed understanding and agreed to proceed. Staff also discussed with the patient that there may be a patient responsible charge related to this service. Patient Location: Home Provider Location: Office Additional Individuals present: None  HPI  Pt presents to follow up on COVID-19 infection and persistent symptoms.  He has been completing his MyChart questionnaire daily and has not been improving.  His PCP Dr. Carlynn Purl requested he schedule a follow up as she is concerned for possible secondary bacterial lung infection.  He is still having fevers/chills - despite taking tylenol around the clock - Tmax 101.69F. He states his cough has been poorly controlled with tessalon and mucinex - still having thick phlegm and a constant need to cough.  Also notes he had 1 episode of vomiting this morning. No history asthma or COPD. Symptoms started on 02/17/19.  Patient Active Problem List   Diagnosis Date Noted  . Medial meniscus tear 11/08/2017  . Lateral meniscus tear 01/30/2016  . Bilateral knee pain 09/27/2014  . Dyslipidemia 09/24/2014  . Decreased libido 09/24/2014  . Goiter 09/24/2014  . Hemorrhoids, internal 09/24/2014  . Dysmetabolic syndrome 09/24/2014  . Obesity (BMI 30.0-34.9) 09/24/2014  . Osteoarthritis of left knee 09/24/2014  . Abnormal blood sugar 11/25/2008  . Cervical pain 11/17/2006    Social History   Tobacco Use    . Smoking status: Never Smoker  . Smokeless tobacco: Never Used  Substance Use Topics  . Alcohol use: No    Alcohol/week: 0.0 standard drinks     Current Outpatient Medications:  .  benzonatate (TESSALON PERLES) 100 MG capsule, Take 1 capsule (100 mg total) by mouth 3 (three) times daily as needed., Disp: 20 capsule, Rfl: 0 .  famotidine (PEPCID) 40 MG tablet, Take 1 tablet (40 mg total) by mouth daily., Disp: 90 tablet, Rfl: 1 .  guaiFENesin (MUCINEX) 600 MG 12 hr tablet, Take 1 tablet (600 mg total) by mouth 2 (two) times daily., Disp: 20 tablet, Rfl: 0 .  Ibuprofen (ADVIL) 200 MG CAPS, Advil, Disp: , Rfl:  .  Misc Natural Products (OSTEO BI-FLEX ADV JOINT SHIELD PO), Take by mouth., Disp: , Rfl:  .  Multiple Vitamins-Minerals (ONE-A-DAY MENS HEALTH FORMULA PO), Take by mouth., Disp: , Rfl:   No Known Allergies  I personally reviewed active problem list, medication list, allergies, notes from last encounter, lab results with the patient/caregiver today.  ROS  Ten systems reviewed and is negative except as mentioned in HPI  Objective  Virtual encounter, vitals not obtained.  There is no height or weight on file to calculate BMI.  Nursing Note and Vital Signs reviewed.  Physical Exam  Constitutional: Patient appears well-developed and well-nourished. No distress.  HENT: Head: Normocephalic and atraumatic.  Neck: Normal range of motion. Pulmonary/Chest: Effort normal. No respiratory distress. Speaking in complete sentences, however he is  coughing throughout our appointment. Neurological: Pt is alert and oriented to person, place, and time. Coordination, speech and gait are normal.  Psychiatric: Patient has a normal mood and affect. behavior is normal. Judgment and thought content normal.   No results found for this or any previous visit (from the past 72 hour(s)).  Assessment & Plan  1. COVID-19 virus infection - promethazine-dextromethorphan (PROMETHAZINE-DM) 6.25-15  MG/5ML syrup; Take 5 mLs by mouth 4 (four) times daily as needed.  Dispense: 118 mL; Refill: 0  2. Febrile respiratory illness - Recommend alternating Ibuprofen and Acetaminophen every 4 hours to gain better control of fever. - promethazine-dextromethorphan (PROMETHAZINE-DM) 6.25-15 MG/5ML syrup; Take 5 mLs by mouth 4 (four) times daily as needed.  Dispense: 118 mL; Refill: 0 - azithromycin (ZITHROMAX) 250 MG tablet; Day 1: Take 2 tablets; Days 2-5: Take 1 tablet once daily.  Dispense: 6 tablet; Refill: 0  3. Cough productive of purulent sputum - Will treat for possible secondary infection based on sputum production and persistent fever.   - promethazine-dextromethorphan (PROMETHAZINE-DM) 6.25-15 MG/5ML syrup; Take 5 mLs by mouth 4 (four) times daily as needed.  Dispense: 118 mL; Refill: 0 - azithromycin (ZITHROMAX) 250 MG tablet; Day 1: Take 2 tablets; Days 2-5: Take 1 tablet once daily.  Dispense: 6 tablet; Refill: 0   -Red flags and when to present for emergency care or RTC including fever >101.62F, chest pain, shortness of breath, new/worsening/un-resolving symptoms, reviewed with patient at time of visit. Follow up and care instructions discussed and provided in AVS. - I discussed the assessment and treatment plan with the patient. The patient was provided an opportunity to ask questions and all were answered. The patient agreed with the plan and demonstrated an understanding of the instructions.  I provided 13 minutes of non-face-to-face time during this encounter.  Hubbard Hartshorn, FNP

## 2019-02-27 ENCOUNTER — Encounter (INDEPENDENT_AMBULATORY_CARE_PROVIDER_SITE_OTHER): Payer: Self-pay

## 2019-02-28 ENCOUNTER — Encounter (INDEPENDENT_AMBULATORY_CARE_PROVIDER_SITE_OTHER): Payer: Self-pay

## 2019-02-28 ENCOUNTER — Telehealth: Payer: Self-pay

## 2019-02-28 NOTE — Telephone Encounter (Signed)
Patient states that he has a cough and only eat yogurt yesterday because he didn't feel like eating. Patient is on medication for cough that was prescribed by pcp. Patient was advise per protocol:  If appetite becomes worse: encourage patient to drink fluids as tolerated, work their way up to bland solid food such as crackers, pretzels, soup, bread or applesauce and boiled starches.   If patient is unable to tolerate any foods or liquids, notify PCP.   IF PATIENT DEVELOPS SEVERE VOMITING (MORE THAN 6 TIMES A DAY AND OR >8 HOURS) AND/OR SEVERE ABDOMINAL PAIN ADVISE PATIENT TO CALL 911 AND SEEK TREATMENT IN ED   If cough remains the same or better: continue to treat with over the counter medications. Hard candy or cough drops and drinking warm fluids. Adults can also use honey 2 tsp (10 ML) at bedtime.   HONEY IS NOT RECOMMENDED FOR INFANTS UNDER ONE.   If cough is becoming worse even with the use of over the counter medications and patient is not able to sleep at night, cough becomes productive with sputum that maybe yellow or green in color, contact PCP.

## 2019-03-01 ENCOUNTER — Encounter (INDEPENDENT_AMBULATORY_CARE_PROVIDER_SITE_OTHER): Payer: Self-pay

## 2019-03-01 ENCOUNTER — Ambulatory Visit: Payer: BC Managed Care – PPO | Admitting: Family Medicine

## 2019-03-01 ENCOUNTER — Telehealth: Payer: Self-pay

## 2019-03-01 ENCOUNTER — Encounter: Payer: Self-pay | Admitting: Family Medicine

## 2019-03-01 NOTE — Telephone Encounter (Signed)
Patient states that he vomited several times yesterday. Patient states that he has a fever this morning of 102. Patient will take some Advil to get his fever down. Patient will also follow up with his pcp. Patient advise as follows:  If appetite becomes worse: encourage patient to drink fluids as tolerated, work their way up to bland solid food such as crackers, pretzels, soup, bread or applesauce and boiled starches.   If patient is unable to tolerate any foods or liquids, notify PCP.   IF PATIENT DEVELOPS SEVERE VOMITING (MORE THAN 6 TIMES A DAY AND OR >8 HOURS) AND/OR SEVERE ABDOMINAL PAIN ADVISE PATIENT TO CALL 911 AND SEEK TREATMENT IN ED   If cough remains the same or better: continue to treat with over the counter medications. Hard candy or cough drops and drinking warm fluids. Adults can also use honey 2 tsp (10 ML) at bedtime.   HONEY IS NOT RECOMMENDED FOR INFANTS UNDER ONE.   If cough is becoming worse even with the use of over the counter medications and patient is not able to sleep at night, cough becomes productive with sputum that maybe yellow or green in color, contact PCP.    Temperature is the same and is noted to be less than 100.4: Continue to monitor at home.   Advise patient to stay hydrated, push oral fluids if able, and advise pt. to avoid using multiple blankets and layer of clothing to prevent overheating.   Avoid over use of anti-pyretic medications for fevers less than 101 degrees Fahrenheit.   Fever helps fight infection.   Worsening temperature, treat if temperature is > 101: treat with OTC anti-fever medications (Tylenol and/or Ibuprofen)   May alternate Tylenol and Ibuprofen every 3 hours as needed for fever greater than 101. Example: Tylenol at 9AM, Ibuprofen at 12PM, Tylenol at 3PM, Ibuprofen at 6PM, Tylenol at 9PM, Ibuprofen at 12AM, Tylenol at 3AM, Ibuprofen at 6AM. Then restart rotation with Tylenol at 9AM.   If fever remains for  greater than 3 days, notify PCP.   If fever becomes greater than 103 and unable to reduce with over the counter medication, contact PCP.

## 2019-03-02 ENCOUNTER — Telehealth: Payer: Self-pay | Admitting: Family Medicine

## 2019-03-02 ENCOUNTER — Ambulatory Visit (INDEPENDENT_AMBULATORY_CARE_PROVIDER_SITE_OTHER): Payer: BC Managed Care – PPO

## 2019-03-02 ENCOUNTER — Encounter: Payer: Self-pay | Admitting: Family Medicine

## 2019-03-02 ENCOUNTER — Encounter (INDEPENDENT_AMBULATORY_CARE_PROVIDER_SITE_OTHER): Payer: Self-pay

## 2019-03-02 ENCOUNTER — Ambulatory Visit (INDEPENDENT_AMBULATORY_CARE_PROVIDER_SITE_OTHER): Payer: BC Managed Care – PPO | Admitting: Family Medicine

## 2019-03-02 ENCOUNTER — Ambulatory Visit (INDEPENDENT_AMBULATORY_CARE_PROVIDER_SITE_OTHER): Payer: HRSA Program | Admitting: Family Medicine

## 2019-03-02 ENCOUNTER — Other Ambulatory Visit: Payer: Self-pay

## 2019-03-02 VITALS — Temp 102.0°F

## 2019-03-02 VITALS — BP 164/100 | HR 101 | Temp 98.6°F | Wt 275.4 lb

## 2019-03-02 DIAGNOSIS — J1282 Pneumonia due to coronavirus disease 2019: Secondary | ICD-10-CM

## 2019-03-02 DIAGNOSIS — R059 Cough, unspecified: Secondary | ICD-10-CM

## 2019-03-02 DIAGNOSIS — U071 COVID-19: Secondary | ICD-10-CM

## 2019-03-02 DIAGNOSIS — R05 Cough: Secondary | ICD-10-CM | POA: Diagnosis not present

## 2019-03-02 DIAGNOSIS — R112 Nausea with vomiting, unspecified: Secondary | ICD-10-CM | POA: Diagnosis not present

## 2019-03-02 DIAGNOSIS — R198 Other specified symptoms and signs involving the digestive system and abdomen: Secondary | ICD-10-CM

## 2019-03-02 DIAGNOSIS — R509 Fever, unspecified: Secondary | ICD-10-CM | POA: Diagnosis not present

## 2019-03-02 DIAGNOSIS — J984 Other disorders of lung: Secondary | ICD-10-CM | POA: Diagnosis not present

## 2019-03-02 DIAGNOSIS — J1289 Other viral pneumonia: Secondary | ICD-10-CM | POA: Diagnosis not present

## 2019-03-02 MED ORDER — ONDANSETRON 4 MG PO TBDP: 4.0000 mg | ORAL_TABLET | Freq: Three times a day (TID) | ORAL | 0 refills | Status: DC | PRN

## 2019-03-02 MED ORDER — CEFDINIR 300 MG PO CAPS: 600.0000 mg | ORAL_CAPSULE | Freq: Every day | ORAL | 0 refills | Status: DC

## 2019-03-02 MED ORDER — PREDNISONE 20 MG PO TABS: 40.0000 mg | ORAL_TABLET | Freq: Every day | ORAL | 0 refills | Status: AC

## 2019-03-02 NOTE — Patient Instructions (Addendum)
I will follow-up with you via My-Chart with your chest x-ray results. For now, start Prednisone 40 mg daily x 5 days to help reduce inflammation in lungs For nausea start Zofran 4 mg ODT For cough, you can take Delsym over the counter, as directed Continue Tylenol for headache management Eat as tolerated. Hydrate well.     COVID-19 Frequently Asked Questions COVID-19 (coronavirus disease) is an infection that is caused by a large family of viruses. Some viruses cause illness in people and others cause illness in animals like camels, cats, and bats. In some cases, the viruses that cause illness in animals can spread to humans. Where did the coronavirus come from? In December 2019, [place] told the [employer] [facility]) of several cases of lung disease (human respiratory illness). These cases were linked to an open seafood and livestock market in the city of [place]. The link to the seafood and livestock market suggests that the virus may have spread from animals to humans. However, since that first outbreak in December, the virus has also been shown to spread from person to person. What is the name of the disease and the virus? Disease name Early on, this disease was called novel coronavirus. This is because scientists determined that the disease was caused by a new (novel) respiratory virus. The World Health Organization [facility]) has now named the disease COVID-19, or coronavirus disease. Virus name The virus that causes the disease is called severe acute respiratory syndrome coronavirus 2 (SARS-CoV-2). More information on disease and virus naming World Health Organization [facility]): www.who.int/emergencies/diseases/novel-coronavirus-2019/technical-guidance/naming-the-coronavirus-disease-(covid-2019)-and-the-virus-that-causes-it Who is at risk for complications from coronavirus disease? Some people may be at higher risk for complications from coronavirus disease. This includes older adults and  people who have chronic diseases, such as heart disease, diabetes, and lung disease. If you are at higher risk for complications, take these extra precautions:  Stay home as much as possible.  Avoid social gatherings and travel.  Avoid close contact with others. Stay at least 6 ft (2 m) away from others, if possible.  Wash your hands often with soap and water for at least 20 seconds.  Avoid touching your face, mouth, nose, or eyes.  Keep supplies on hand at home, such as food, medicine, and cleaning supplies.  If you must go out in public, wear a cloth face covering or face mask. Make sure your mask covers your nose and mouth. How does coronavirus disease spread? The virus that causes coronavirus disease spreads easily from person to person (is contagious). You may catch the virus by:  Breathing in droplets from an infected person. Droplets can be spread by a person breathing, speaking, singing, coughing, or sneezing.  Touching something, like a table or a doorknob, that was exposed to the virus (contaminated) and then touching your mouth, nose, or eyes. Can I get the virus from touching surfaces or objects? There is still a lot that we do not know about the virus that causes coronavirus disease. Scientists are basing a lot of information on what they know about similar viruses, such as:  Viruses cannot generally survive on surfaces for long. They need a human body (host) to survive.  It is more likely that the virus is spread by close contact with people who are sick (direct contact), such as through: ? Shaking hands or hugging. ? Breathing in respiratory droplets that travel through the air. Droplets can be spread by a person breathing, speaking, singing, coughing, or sneezing.  It is less likely that  the virus is spread when a person touches a surface or object that has the virus on it (indirect contact). The virus may be able to enter the body if the person touches a surface or  object and then touches his or her face, eyes, nose, or mouth. Can a person spread the virus without having symptoms of the disease? It may be possible for the virus to spread before a person has symptoms of the disease, but this is most likely not the main way the virus is spreading. It is more likely for the virus to spread by being in close contact with people who are sick and breathing in the respiratory droplets spread by a person breathing, speaking, singing, coughing, or sneezing. What are the symptoms of coronavirus disease? Symptoms vary from person to person and can range from mild to severe. Symptoms may include:  Fever or chills.  Cough.  Difficulty breathing or feeling short of breath.  Headaches, body aches, or muscle aches.  Runny or stuffy (congested) nose.  Sore throat.  New loss of taste or smell.  Nausea, vomiting, or diarrhea. These symptoms can appear anywhere from 2 to 14 days after you have been exposed to the virus. Some people may not have any symptoms. If you develop symptoms, call your health care provider. People with severe symptoms may need hospital care. Should I be tested for this virus? Your health care provider will decide whether to test you based on your symptoms, history of exposure, and your risk factors. How does a health care provider test for this virus? Health care providers will collect samples to send for testing. Samples may include:  Taking a swab of fluid from the back of your nose and throat, your nose, or your throat.  Taking fluid from the lungs by having you cough up mucus (sputum) into a sterile cup.  Taking a blood sample. Is there a treatment or vaccine for this virus? Currently, there is no vaccine to prevent coronavirus disease. Also, there are no medicines like antibiotics or antivirals to treat the virus. A person who becomes sick is given supportive care, which means rest and fluids. A person may also relieve his or her  symptoms by using over-the-counter medicines that treat sneezing, coughing, and runny nose. These are the same medicines that a person takes for the common cold. If you develop symptoms, call your health care provider. People with severe symptoms may need hospital care. What can I do to protect myself and my family from this virus?     You can protect yourself and your family by taking the same actions that you would take to prevent the spread of other viruses. Take the following actions:  Wash your hands often with soap and water for at least 20 seconds. If soap and water are not available, use alcohol-based hand sanitizer.  Avoid touching your face, mouth, nose, or eyes.  Cough or sneeze into a tissue, sleeve, or elbow. Do not cough or sneeze into your hand or the air. ? If you cough or sneeze into a tissue, throw it away immediately and wash your hands.  Disinfect objects and surfaces that you frequently touch every day.  Stay away from people who are sick.  Avoid going out in public, follow guidance from your state and local health authorities.  Avoid crowded indoor spaces. Stay at least 6 ft (2 m) away from others.  If you must go out in public, wear a cloth face covering or  face mask. Make sure your mask covers your nose and mouth.  Stay home if you are sick, except to get medical care. Call your health care provider before you get medical care. Your health care provider will tell you how long to stay home.  Make sure your vaccines are up to date. Ask your health care provider what vaccines you need. What should I do if I need to travel? Follow travel recommendations from your local health authority, the CDC, and WHO. Travel information and advice  Centers for Disease Control and Prevention (CDC): BodyEditor.hu  World Health Organization [facility]): ThirdIncome.ca Know the risks and  take action to protect your health  You are at higher risk of getting coronavirus disease if you are traveling to areas with an outbreak or if you are exposed to travelers from areas with an outbreak.  Wash your hands often and practice good hygiene to lower the risk of catching or spreading the virus. What should I do if I am sick? General instructions to stop the spread of infection  Wash your hands often with soap and water for at least 20 seconds. If soap and water are not available, use alcohol-based hand sanitizer.  Cough or sneeze into a tissue, sleeve, or elbow. Do not cough or sneeze into your hand or the air.  If you cough or sneeze into a tissue, throw it away immediately and wash your hands.  Stay home unless you must get medical care. Call your health care provider or local health authority before you get medical care.  Avoid public areas. Do not take public transportation, if possible.  If you can, wear a mask if you must go out of the house or if you are in close contact with someone who is not sick. Make sure your mask covers your nose and mouth. Keep your home clean  Disinfect objects and surfaces that are frequently touched every day. This may include: ? Counters and tables. ? Doorknobs and light switches. ? Sinks and faucets. ? Electronics such as phones, remote controls, keyboards, computers, and tablets.  Wash dishes in hot, soapy water or use a dishwasher. Air-dry your dishes.  Wash laundry in hot water. Prevent infecting other household members  Let healthy household members care for children and pets, if possible. If you have to care for children or pets, wash your hands often and wear a mask.  Sleep in a different bedroom or bed, if possible.  Do not share personal items, such as razors, toothbrushes, deodorant, combs, brushes, towels, and washcloths. Where to find more information Centers for Disease Control and Prevention (CDC)  Information and news  updates: https://www.butler-gonzalez.com/ World Health Organization [facility])  Information and news updates: MissExecutive.com.ee  Coronavirus health topic: https://www.castaneda.info/  Questions and answers on COVID-19: OpportunityDebt.at  Global tracker: who.sprinklr.com American Academy of Pediatrics (AAP)  Information for families: www.healthychildren.org/English/health-issues/conditions/chest-lungs/Pages/2019-Novel-Coronavirus.aspx The coronavirus situation is changing rapidly. Check your local health authority website or the CDC and [facility] websites for updates and news. When should I contact a health care provider?  Contact your health care provider if you have symptoms of an infection, such as fever or cough, and you: ? Have been near anyone who is known to have coronavirus disease. ? Have come into contact with a person who is suspected to have coronavirus disease. ? Have traveled to an area where there is an outbreak of COVID-19. When should I get emergency medical care?  Get help right away by calling your local emergency services (911  in the [place] if you have: ? Trouble breathing. ? Pain or pressure in your chest. ? Confusion. ? Blue-tinged lips and fingernails. ? Difficulty waking from sleep. ? Symptoms that get worse. Let the emergency medical personnel know if you think you have coronavirus disease. Summary  A new respiratory virus is spreading from person to person and causing COVID-19 (coronavirus disease).  The virus that causes COVID-19 appears to spread easily. It spreads from one person to another through droplets from breathing, speaking, singing, coughing, or sneezing.  Older adults and those with chronic diseases are at higher risk of disease. If you are at higher risk for complications, take extra precautions.  There is currently no vaccine to prevent coronavirus disease. There are no  medicines, such as antibiotics or antivirals, to treat the virus.  You can protect yourself and your family by washing your hands often, avoiding touching your face, and covering your coughs and sneezes. This information is not intended to replace advice given to you by your health care provider. Make sure you discuss any questions you have with your health care provider. Document Revised: 11/03/2018 Document Reviewed: 05/02/2018 Elsevier Patient Education  2020 [employer].

## 2019-03-02 NOTE — Progress Notes (Signed)
Name: Jeffrey Schneider   MRN: [id number]    DOB: 10/15/1970   Date:03/02/2019       Progress Note  Subjective  Chief Complaint  Chief Complaint  Patient presents with  . Fever    102  . Emesis  . Cough    I connected with  Jeffrey Schneider on 03/02/19 at  1:00 PM EST by telephone and verified that I am speaking with the correct person using two identifiers.  I discussed the limitations, risks, security and privacy concerns of performing an evaluation and management service by telephone and the availability of in person appointments. Staff also discussed with the patient that there may be a patient responsible charge related to this service. Patient Location: at home  Provider Location: [facility]    HPI  COVID-19: he states symptoms started around 02/19/2019, he states on the 2nd he felt hot, had chills and fatigue. He went to [facility] and tested positive, he states that on Feb 8 th his temperature spiked to 101, he had a productive cough , he also developed lack of appetite, nausea and vomiting and felt dehydrated,  he had a virtual visit with Raelyn Ensign and was given a Zpack and cough medication. He states he continues to have intermittent vomiting and temperature is still spiking. Yesterday am was over 102. Last dose of Zpack was this am. He continues to have a productive cough. Discussed the respiratory clinic and he will go there tonight.    Patient Active Problem List   Diagnosis Date Noted  . Medial meniscus tear 11/08/2017  . Lateral meniscus tear 01/30/2016  . Bilateral knee pain 09/27/2014  . Dyslipidemia 09/24/2014  . Decreased libido 09/24/2014  . Goiter 09/24/2014  . Hemorrhoids, internal 09/24/2014  . Dysmetabolic syndrome 40/98/1191  . Obesity (BMI 30.0-34.9) 09/24/2014  . Osteoarthritis of left knee 09/24/2014  . Abnormal blood sugar 11/25/2008  . Cervical pain 11/17/2006    Past Surgical History:  Procedure Laterality Date    . ACHILLES TENDON SURGERY Left    Past Rupture    Family History  Problem Relation Age of Onset  . Depression Mother   . Obesity Mother   . Alcohol abuse Father   . Cirrhosis Father   . Cirrhosis Sister   . Alcohol abuse Brother   . Allergic rhinitis Daughter   . Allergic rhinitis Son   . Cancer Maternal Grandmother        Colon      Tobacco Use: Low Risk   . Smoking Tobacco Use: Never Smoker  . Smokeless Tobacco Use: Never Used   Social History   Substance and Sexual Activity  Alcohol Use No  . Alcohol/week: 0.0 standard drinks    Current Outpatient Medications:  .  famotidine (PEPCID) 40 MG tablet, Take 1 tablet (40 mg total) by mouth daily., Disp: 90 tablet, Rfl: 1 .  guaiFENesin (MUCINEX) 600 MG 12 hr tablet, Take 1 tablet (600 mg total) by mouth 2 (two) times daily., Disp: 20 tablet, Rfl: 0 .  Ibuprofen (ADVIL) 200 MG CAPS, Advil, Disp: , Rfl:  .  Misc Natural Products (OSTEO BI-FLEX ADV JOINT SHIELD PO), Take by mouth., Disp: , Rfl:  .  Multiple Vitamins-Minerals (ONE-A-DAY MENS HEALTH FORMULA PO), Take by mouth., Disp: , Rfl:  .  promethazine-dextromethorphan (PROMETHAZINE-DM) 6.25-15 MG/5ML syrup, Take 5 mLs by mouth 4 (four) times daily as needed., Disp: 118 mL, Rfl: 0 .  azithromycin (ZITHROMAX) 250 MG tablet, Day  1: Take 2 tablets; Days 2-5: Take 1 tablet once daily. (Patient not taking: Reported on 03/02/2019), Disp: 6 tablet, Rfl: 0  No Known Allergies  I personally reviewed active problem list, medication list, allergies, family history, social history with the patient/caregiver today.   ROS  Ten systems reviewed and is negative except as mentioned in HPI   Objective  Virtual encounter, vitals not obtained.  There is no height or weight on file to calculate BMI.  Physical Exam  Awake, alert and oriented, he did not seem sob during the visit   PHQ2/9: Depression screen [facility] 2/9 03/02/2019 02/26/2019 02/21/2019 11/10/2018 05/11/2018  Decreased  Interest 0 0 0 3 0  Down, Depressed, Hopeless 0 0 0 1 0  PHQ - 2 Score 0 0 0 4 0  Altered sleeping 0 0 0 1 0  Tired, decreased energy 0 0 0 3 0  Change in appetite 0 0 0 1 1  Feeling bad or failure about yourself  0 0 0 2 0  Trouble concentrating 0 0 0 1 0  Moving slowly or fidgety/restless 0 0 0 0 0  Suicidal thoughts 0 0 0 0 0  PHQ-9 Score 0 0 0 12 1  Difficult doing work/chores Not difficult at all Not difficult at all Not difficult at all Not difficult at all Not difficult at all   PHQ-2/9 Result is negative.    Fall Risk: Fall Risk  03/02/2019 02/26/2019 02/21/2019 11/10/2018 05/11/2018  Falls in the past year? 0 0 0 0 0  Number falls in past yr: 0 0 0 0 0  Comment - - - - -  Injury with Fall? 0 0 0 0 0  Follow up Falls evaluation completed Falls evaluation completed Falls evaluation completed - Falls evaluation completed     Assessment & Plan   1. COVID-19 virus infection  He will go to the respiratory clinic at this time, continue self quarantine, extend time off work until Feb 17th but he must be fever free without medications for 24 hours before returning to work and feeling clinically better   I discussed the assessment and treatment plan with the patient. The patient was provided an opportunity to ask questions and all were answered. The patient agreed with the plan and demonstrated an understanding of the instructions.   The patient was advised to call back or seek an in-person evaluation if the symptoms worsen or if the condition fails to improve as anticipated.  I provided 15  minutes of non-face-to-face time during this encounter.  Ruel Favors, MD

## 2019-03-02 NOTE — Telephone Encounter (Signed)
Copied from CRM [id number]. Topic: Appointment Scheduling - Scheduling Inquiry for Clinic >> Mar 01, 2019  1:19 PM Randol Kern wrote: Reason for CRM: Pt called per request of Dr. Carlynn Purl via MyChart, pt has a fever with a temperature of 97.4. Advised to await contact from office when provider is coordinated.  Best contact: [phone]

## 2019-03-02 NOTE — Progress Notes (Signed)
Patient ID: Jeffrey Schneider, male    DOB: July 17, 1970, 49 y.o.   MRN: [id number]  PCP: Alba Cory, MD  No chief complaint on file.   Subjective:  HPI  Jeffrey Schneider is a 49 y.o. male presents to [facility] for evaluation of symptoms related to recent diagnosis of COVID-19.  Current symptoms include nausea, vomiting, abdominal pain, dry cough, poor appetite, scratchy throat and hoarseness of voice. Fever 102.6 TMAX.  Is having some generalized body ache and mild chest tightness with deep inhalations.  He is not wheezing denies any shortness of breath with exertional activities.  He has completed antibiotic treatment with azithromycin and promethazine for cough.  He has achieved no significant improvement of symptoms.  Cough is his most worrisome symptom as he is coughing persistently.  Review of Systems Pertinent negatives listed in HPI Patient Active Problem List   Diagnosis Date Noted  . Medial meniscus tear 11/08/2017  . Lateral meniscus tear 01/30/2016  . Bilateral knee pain 09/27/2014  . Dyslipidemia 09/24/2014  . Decreased libido 09/24/2014  . Goiter 09/24/2014  . Hemorrhoids, internal 09/24/2014  . Dysmetabolic syndrome 09/24/2014  . Obesity (BMI 30.0-34.9) 09/24/2014  . Osteoarthritis of left knee 09/24/2014  . Abnormal blood sugar 11/25/2008  . Cervical pain 11/17/2006      Prior to Admission medications   Medication Sig Start Date End Date Taking? Authorizing Provider  famotidine (PEPCID) 40 MG tablet Take 1 tablet (40 mg total) by mouth daily. 06/02/18  Yes Sowles, Danna Hefty, MD  guaiFENesin (MUCINEX) 600 MG 12 hr tablet Take 1 tablet (600 mg total) by mouth 2 (two) times daily. 02/21/19  Yes Doren Custard, FNP  Ibuprofen (ADVIL) 200 MG CAPS Advil   Yes [provider]  Misc Natural Products (OSTEO BI-FLEX ADV JOINT SHIELD PO) Take by mouth.   Yes [provider]  Multiple Vitamins-Minerals (ONE-A-DAY MENS HEALTH FORMULA PO)  Take by mouth.   Yes [provider]  promethazine-dextromethorphan (PROMETHAZINE-DM) 6.25-15 MG/5ML syrup Take 5 mLs by mouth 4 (four) times daily as needed. 02/26/19  Yes Doren Custard, FNP    Past Medical, Surgical Family and Social History reviewed and updated.    Objective:   Today's Vitals   03/02/19 1841  BP: (!) 164/100  Pulse: (!) 101  Temp: 98.6 F (37 C)  SpO2: 98%  Weight: 275 lb 6.4 oz (124.9 kg)    Wt Readings from Last 3 Encounters:  03/02/19 275 lb 6.4 oz (124.9 kg)  11/10/18 275 lb 11.2 oz (125.1 kg)  05/11/18 269 lb 3.2 oz (122.1 kg)     Physical Exam Constitutional:      Appearance: He is ill-appearing.  HENT:     Head: Normocephalic.     Nose: Congestion present.     Mouth/Throat:     Mouth: Mucous membranes are dry.  Eyes:     Extraocular Movements: Extraocular movements intact.     Conjunctiva/sclera: Conjunctivae normal.     Pupils: Pupils are equal, round, and reactive to light.  Cardiovascular:     Rate and Rhythm: Tachycardia present.  Pulmonary:     Breath sounds: Decreased air movement present. Rhonchi present.  Lymphadenopathy:     Cervical: No cervical adenopathy.  Skin:    General: Skin is warm.  Neurological:     Mental Status: He is oriented to person, place, and time.  Psychiatric:        Mood and Affect: Mood normal.  Assessment & Plan:  1. COVID-19 virus infection, remained quarantined given symptoms. - DG Chest Port 1 [address] Future, pending to rule out pneumonia -Indication warranting emergent evaluation in the ER discussed.   DG Chest Port 1 View Result Date: 03/02/2019 CLINICAL DATA:  COVID-19 diagnosis 10 days ago, persistent cough EXAM: PORTABLE CHEST 1 VIEW COMPARISON:  None. FINDINGS: Single frontal view of the chest demonstrates an unremarkable cardiac silhouette. Right greater than left patchy airspace disease greatest at the right lung base. No effusion or pneumothorax. IMPRESSION: 1. Findings  consistent with multifocal pneumonia, certainly compatible with given history of COVID-19. Electronically Signed   By: Randa Ngo M.D.   On: 03/02/2019 20:51   2. GI symptoms - Zofran 4 mg every 8 hours as needed for nausea -BRAT diet and eat as tolerated -Force fluids to reduce risk of dehydration   3. Cough -Prednisone 40 mg x 5 days -Continue promethazine-DM at bedtime and Delsym during the day as needed     Meds ordered this encounter  Medications  . ondansetron (ZOFRAN ODT) 4 MG disintegrating tablet    Sig: Take 1 tablet (4 mg total) by mouth every 8 (eight) hours as needed for nausea or vomiting.    Dispense:  20 tablet    Refill:  0  . predniSONE (DELTASONE) 20 MG tablet    Sig: Take 2 tablets (40 mg total) by mouth daily with breakfast for 5 days.    Dispense:  10 tablet    Refill:  0     Addendum: patient notified following visit via My Chart of abnormal chest x-ray results noting pneumonia. He was prescribed Cefdinir 600 mg daily x 10 days. Will return in 1 week for follow-up.   -The patient was given clear instructions to go to ER or return to medical center if symptoms do not improve, worsen or new problems develop. The patient verbalized understanding.     Molli Barrows, FNP-C [facility], PRN Provider  [facility]. [place], [place] Clinic Phone: [phone] Clinic Fax: [phone] Clinic Hours: 5:30 pm -7:30 pm (Monday-Friday)

## 2019-03-02 NOTE — Progress Notes (Unsigned)
  Patient ID: Jeffrey Schneider, male    DOB: Feb 01, 1970, 49 y.o.   MRN: [id number]  PCP: Alba Cory, MD  No chief complaint on file.   Subjective:  HPI  Jeffrey Schneider is a 49 y.o. male presents to [facility] clinic for evaluation of symptoms related to     Review of Systems   Patient Active Problem List   Diagnosis Date Noted  . Medial meniscus tear 11/08/2017  . Lateral meniscus tear 01/30/2016  . Bilateral knee pain 09/27/2014  . Dyslipidemia 09/24/2014  . Decreased libido 09/24/2014  . Goiter 09/24/2014  . Hemorrhoids, internal 09/24/2014  . Dysmetabolic syndrome 09/24/2014  . Obesity (BMI 30.0-34.9) 09/24/2014  . Osteoarthritis of left knee 09/24/2014  . Abnormal blood sugar 11/25/2008  . Cervical pain 11/17/2006      Prior to Admission medications   Medication Sig Start Date End Date Taking? Authorizing Provider  cefdinir (OMNICEF) 300 MG capsule Take 2 capsules (600 mg total) by mouth daily. 03/02/19   Bing Neighbors, FNP  famotidine (PEPCID) 40 MG tablet Take 1 tablet (40 mg total) by mouth daily. 06/02/18   Alba Cory, MD  guaiFENesin (MUCINEX) 600 MG 12 hr tablet Take 1 tablet (600 mg total) by mouth 2 (two) times daily. 02/21/19   Doren Custard, FNP  Ibuprofen (ADVIL) 200 MG CAPS Advil    [provider]  Misc Natural Products (OSTEO BI-FLEX ADV JOINT SHIELD PO) Take by mouth.    [provider]  Multiple Vitamins-Minerals (ONE-A-DAY MENS HEALTH FORMULA PO) Take by mouth.    [provider]  ondansetron (ZOFRAN ODT) 4 MG disintegrating tablet Take 1 tablet (4 mg total) by mouth every 8 (eight) hours as needed for nausea or vomiting. 03/02/19   Bing Neighbors, FNP  predniSONE (DELTASONE) 20 MG tablet Take 2 tablets (40 mg total) by mouth daily with breakfast for 5 days. 03/02/19 03/07/19  Bing Neighbors, FNP  promethazine-dextromethorphan (PROMETHAZINE-DM) 6.25-15 MG/5ML syrup Take 5 mLs by mouth 4 (four)  times daily as needed. 02/26/19   Doren Custard, FNP    Past Medical, Surgical Family and Social History reviewed and updated.    Objective:  There were no vitals filed for this visit.  Wt Readings from Last 3 Encounters:  03/02/19 275 lb 6.4 oz (124.9 kg)  11/10/18 275 lb 11.2 oz (125.1 kg)  05/11/18 269 lb 3.2 oz (122.1 kg)     Physical Exam      Assessment & Plan:  1. COVID-19 virus infection *** - DG Chest Port 1 View  2. GI symptoms *** - DG Chest Port 1 View  3. Pneumonia due to COVID-19 virus ***      -The patient was given clear instructions to go to ER or return to medical center if symptoms do not improve, worsen or new problems develop. The patient verbalized understanding.     Joaquin Courts, FNP-C [facility], PRN Provider  [facility]. [place], [place]  Clinic Phone: [phone] Clinic Fax: [phone] Clinic Hours: 5:30 pm -7:30 pm (Monday-Friday)

## 2019-03-03 ENCOUNTER — Encounter (INDEPENDENT_AMBULATORY_CARE_PROVIDER_SITE_OTHER): Payer: Self-pay

## 2019-03-04 ENCOUNTER — Telehealth: Payer: Self-pay

## 2019-03-04 ENCOUNTER — Encounter (INDEPENDENT_AMBULATORY_CARE_PROVIDER_SITE_OTHER): Payer: Self-pay

## 2019-03-04 NOTE — Telephone Encounter (Signed)
Pt has vomited 1 time in the past 24 hours. Vomiting began 2 days. C/O mid back pain, mid to left side chest pain x 2 weeks. No issues with breathing. No SOB. Frequent cough last night. Back pain gradual onset, stated pain feels like heaviness and tightness x 1 week. Cough is dry and does have occasional post tussive emesis  Bad taste in mouth.  No radiating pain pain.  . For vomiting: develops severe vomiting (more than 6 times a day and or >8 hours) and/or severe abdominal pain advise patient If appetite becomes worse:   encourage patient to drink fluids as tolerated, work their way up to bland solid food such as crackers, pretzels, soup, bread or applesauce and boiled starches.  . If patient is unable to tolerate any foods or liquids, notify PCP.  If patient to call 911 and seek treatment in ED. Pt verbalized understanding.

## 2019-03-05 ENCOUNTER — Encounter (INDEPENDENT_AMBULATORY_CARE_PROVIDER_SITE_OTHER): Payer: Self-pay

## 2019-03-05 ENCOUNTER — Ambulatory Visit: Payer: BC Managed Care – PPO

## 2019-03-05 NOTE — Telephone Encounter (Signed)
I called pt and he agreed to schedule the appt on 03/09/2019 at 5:30. Nothing further is needed.

## 2019-03-06 ENCOUNTER — Other Ambulatory Visit: Payer: Self-pay | Admitting: Family Medicine

## 2019-03-06 ENCOUNTER — Encounter: Payer: Self-pay | Admitting: Family Medicine

## 2019-03-06 ENCOUNTER — Encounter (INDEPENDENT_AMBULATORY_CARE_PROVIDER_SITE_OTHER): Payer: Self-pay

## 2019-03-06 DIAGNOSIS — U071 COVID-19: Secondary | ICD-10-CM

## 2019-03-09 ENCOUNTER — Ambulatory Visit (INDEPENDENT_AMBULATORY_CARE_PROVIDER_SITE_OTHER): Payer: BC Managed Care – PPO | Admitting: Family Medicine

## 2019-03-09 ENCOUNTER — Other Ambulatory Visit: Payer: Self-pay

## 2019-03-09 VITALS — BP 130/80 | HR 81 | Temp 98.9°F | Wt 270.8 lb

## 2019-03-09 DIAGNOSIS — Z8616 Personal history of COVID-19: Secondary | ICD-10-CM

## 2019-03-09 DIAGNOSIS — Z8701 Personal history of pneumonia (recurrent): Secondary | ICD-10-CM | POA: Diagnosis not present

## 2019-03-09 DIAGNOSIS — U071 COVID-19: Secondary | ICD-10-CM

## 2019-03-09 NOTE — Progress Notes (Signed)
Patient ID: Jeffrey Schneider, male    DOB: 1970-01-31, 49 y.o.   MRN: [id number]  PCP: Steele Sizer, MD  No chief complaint on file.   Subjective:  HPI  Jeffrey Schneider is a 49 y.o. male presents to [facility] clinic for evaluation of symptoms related to recent diagnosis of COVID-19. Patient was seen in clinic one week ago here at the clinic and diagnosed with pneumonia following several days of elevated temperature and tachycardia. He was treated with 10 days of cefdinir and reports today resolution of symptoms. He continues have a mild cough which is non-worrisome.  He as returned to normal daily activities and is back at work. He has remained afebrile over the last several days. He not taking any other medication. He has 3 days of antibiotic therapy remaining. Denies any chest pain, shortness of breath, or GI symptoms.  Review of Systems Pertinent negatives listed in HPI Patient Active Problem List   Diagnosis Date Noted  . Medial meniscus tear 11/08/2017  . Lateral meniscus tear 01/30/2016  . Bilateral knee pain 09/27/2014  . Dyslipidemia 09/24/2014  . Decreased libido 09/24/2014  . Goiter 09/24/2014  . Hemorrhoids, internal 09/24/2014  . Dysmetabolic syndrome 70/62/3762  . Obesity (BMI 30.0-34.9) 09/24/2014  . Osteoarthritis of left knee 09/24/2014  . Abnormal blood sugar 11/25/2008  . Cervical pain 11/17/2006      Prior to Admission medications   Medication Sig Start Date End Date Taking? Authorizing Provider  famotidine (PEPCID) 40 MG tablet Take 1 tablet (40 mg total) by mouth daily. 06/02/18  Yes Sowles, Drue Stager, MD  guaiFENesin (MUCINEX) 600 MG 12 hr tablet Take 1 tablet (600 mg total) by mouth 2 (two) times daily. 02/21/19  Yes Hubbard Hartshorn, FNP  Ibuprofen (ADVIL) 200 MG CAPS Advil   Yes [provider]  Misc Natural Products (OSTEO BI-FLEX ADV JOINT SHIELD PO) Take by mouth.   Yes [provider]  Multiple Vitamins-Minerals (ONE-A-DAY  MENS HEALTH FORMULA PO) Take by mouth.   Yes [provider]  promethazine-dextromethorphan (PROMETHAZINE-DM) 6.25-15 MG/5ML syrup Take 5 mLs by mouth 4 (four) times daily as needed. 02/26/19  Yes Hubbard Hartshorn, FNP    Past Medical, Surgical Family and Social History reviewed and updated.    Objective:    Vitals:   03/09/19 1726  BP: 130/80  Pulse: 81  Temp: 98.9 F (37.2 C)  SpO2: 99%     Wt Readings from Last 3 Encounters:  03/02/19 275 lb 6.4 oz (124.9 kg)  11/10/18 275 lb 11.2 oz (125.1 kg)  05/11/18 269 lb 3.2 oz (122.1 kg)    Physical Exam Constitutional:      Appearance: Well appearing and Well developed.  Eyes:     Extraocular Movements: Extraocular movements intact.     Conjunctiva/sclera: Conjunctivae normal.     Pupils: Pupils are equal, round, and reactive to light.  Cardiovascular:     Rate and Rhythm: Normal rate, rhythm no murmurs or gallops noted on exam  Pulmonary:     Breath sounds: Normal air entry. Equal, clear, breath sounds. Negative for adventitious lung. Lymphadenopathy:   Cervical : No cervical adenopathy.  Skin:    General: Skin is warm.  Neurological:     Mental Status: He is oriented to person, place, and time.  Psychiatric:        Mood and Affect: Mood normal.    Assessment & Plan:  1. History of pneumonia, Symptoms have resolved. Lung exam unremarkable and  reassuring.  -Complete remaining doses of cefdinir. -Encourage good lung hygiene with deep breathing and avoidance of prolonged inactivity.  2. COVID-19 virus infection -Quarantine period ended 03/03/19. -Afebrile for >3 days. -Red Flags discussed regarding post-COVID respiratory symptoms and indications to follow-up. -Encouraged obtaining vaccine once available -Continue daily vitamin supplement   -The patient was given clear instructions to go to ER or return to medical center if symptoms do not improve, worsen or new problems develop. The patient verbalized  understanding.     Joaquin Courts, FNP-C [facility], PRN Provider  [facility]. [place], [place]  Clinic Phone: [phone] Clinic Fax: [phone] Clinic Hours: 5:30 pm -7:30 pm (Monday-Friday)

## 2019-03-09 NOTE — Patient Instructions (Signed)
Continue multi-vitamin. Recommend respiratory exercises with deep breathing. Resume activities as tolerated. If any symptoms reoccur, please schedule a follow-up.  I recommend obtaining Covid Vaccines once available.

## 2019-04-06 ENCOUNTER — Ambulatory Visit: Payer: BC Managed Care – PPO | Attending: Internal Medicine

## 2019-04-06 DIAGNOSIS — Z23 Encounter for immunization: Secondary | ICD-10-CM

## 2019-04-06 NOTE — Progress Notes (Signed)
   Covid-19 Vaccination Clinic  Name:  BROCK LARMON    MRN: [id number] DOB: 13-Jun-1970  04/06/2019  Mr. Frede was observed post Covid-19 immunization for 15 minutes without incident. He was provided with Vaccine Information Sheet and instruction to access the V-Safe system.   Mr. Donaho was instructed to call 911 with any severe reactions post vaccine: Marland Kitchen Difficulty breathing  . Swelling of face and throat  . A fast heartbeat  . A bad rash all over body  . Dizziness and weakness   Immunizations Administered    Name Date Dose VIS Date Route   Pfizer COVID-19 Vaccine 04/06/2019  9:59 AM 0.3 mL 12/29/2018 Intramuscular   Manufacturer: [employer], [employer]   Lot: [id number]   NDC: [address]-1

## 2019-04-14 ENCOUNTER — Ambulatory Visit: Payer: BC Managed Care – PPO

## 2019-04-19 ENCOUNTER — Emergency Department: Payer: BC Managed Care – PPO

## 2019-04-19 ENCOUNTER — Other Ambulatory Visit: Payer: Self-pay

## 2019-04-19 ENCOUNTER — Emergency Department
Admission: EM | Admit: 2019-04-19 | Discharge: 2019-04-19 | Disposition: A | Discharge status: Home / Self Care | Payer: BC Managed Care – PPO | Attending: Emergency Medicine | Admitting: Emergency Medicine

## 2019-04-19 DIAGNOSIS — J9801 Acute bronchospasm: Secondary | ICD-10-CM | POA: Insufficient documentation

## 2019-04-19 DIAGNOSIS — R0789 Other chest pain: Secondary | ICD-10-CM | POA: Diagnosis not present

## 2019-04-19 DIAGNOSIS — R079 Chest pain, unspecified: Secondary | ICD-10-CM

## 2019-04-19 LAB — TROPONIN I (HIGH SENSITIVITY): Troponin I (High Sensitivity): 3 ng/L (ref <18)

## 2019-04-19 LAB — BASIC METABOLIC PANEL
Anion gap: 13 (ref 5–15)
BUN: 15 mg/dL (ref 6–20)
CO2: 23 mmol/L (ref 22–32)
Calcium: 9.6 mg/dL (ref 8.9–10.3)
Chloride: 102 mmol/L (ref 98–111)
Creatinine, Ser: 1.11 mg/dL (ref 0.61–1.24)
GFR calc Af Amer: >60 mL/min (ref >60)
GFR calc non Af Amer: >60 mL/min (ref >60)
Glucose, Bld: 144 mg/dL — ABNORMAL HIGH (ref 70–99)
Potassium: 3.7 mmol/L (ref 3.5–5.1)
Sodium: 138 mmol/L (ref 135–145)

## 2019-04-19 LAB — CBC
HCT: 42.2 % (ref 39.0–52.0)
Hemoglobin: 14.5 g/dL (ref 13.0–17.0)
MCH: 28.8 pg (ref 26.0–34.0)
MCHC: 34.4 g/dL (ref 30.0–36.0)
MCV: 83.9 fL (ref 80.0–100.0)
Platelets: 234 10*3/uL (ref 150–400)
RBC: 5.03 MIL/uL (ref 4.22–5.81)
RDW: 13.3 % (ref 11.5–15.5)
WBC: 7.3 10*3/uL (ref 4.0–10.5)
nRBC: 0 % (ref 0.0–0.2)

## 2019-04-19 MED ORDER — ALBUTEROL SULFATE HFA 108 (90 BASE) MCG/ACT IN AERS: 2.0000 | INHALATION_SPRAY | Freq: Four times a day (QID) | RESPIRATORY_TRACT | 0 refills | Status: DC | PRN

## 2019-04-19 MED ORDER — SODIUM CHLORIDE 0.9% FLUSH: 3.0000 mL | Freq: Once | INTRAVENOUS | Status: DC

## 2019-04-19 MED ORDER — PREDNISONE 50 MG PO TABS: 50.0000 mg | ORAL_TABLET | Freq: Every day | ORAL | 0 refills | Status: DC

## 2019-04-19 NOTE — ED Triage Notes (Addendum)
Pt c/o chest pressure and feeling "jittery" - pt was covid+ in Feb and had pneumonia and is concerned that this could be causing symptoms today - denies SHOB/dizziness/nausea/vomiting  Pt reports that last week he had a pain with burning in his penis that has seen resolved

## 2019-04-19 NOTE — ED Provider Notes (Signed)
[facility] Emergency Department Provider Note   ____________________________________________    I have reviewed the triage vital signs and the nursing notes.   HISTORY  Chief Complaint Chest Pain     HPI Jeffrey Schneider is a 49 y.o. male who presents with complaints of chest tightness, now essentially resolved.  Patient reports that he had COVID-19 in February and has been having some difficulties since that time.  Today he went on a walk and was exposed to the cold air and felt a tightness in his chest and felt mildly short of breath.  Symptoms have essentially resolved at this point.  He denies pleurisy.  Denies fevers or chills.  No calf pain or swelling.  No history of blood clots.  Is not take anything for this.  No diaphoresis nausea or vomiting.  Symptoms occurred this morning  Past Medical History:  Diagnosis Date  . Abnormal glucose   . Cervicalgia   . Decreased libido   . Erectile dysfunction   . Hypogonadism, male   . Internal hemorrhoids   . Left knee pain   . Metabolic syndrome   . Obesity   . Thyroid enlarged     Patient Active Problem List   Diagnosis Date Noted  . Medial meniscus tear 11/08/2017  . Lateral meniscus tear 01/30/2016  . Bilateral knee pain 09/27/2014  . Dyslipidemia 09/24/2014  . Decreased libido 09/24/2014  . Goiter 09/24/2014  . Hemorrhoids, internal 09/24/2014  . Dysmetabolic syndrome 09/24/2014  . Obesity (BMI 30.0-34.9) 09/24/2014  . Osteoarthritis of left knee 09/24/2014  . Abnormal blood sugar 11/25/2008  . Cervical pain 11/17/2006    Past Surgical History:  Procedure Laterality Date  . ACHILLES TENDON SURGERY Left    Past Rupture    Prior to Admission medications   Medication Sig Start Date End Date Taking? Authorizing Provider  albuterol (VENTOLIN HFA) 108 (90 Base) MCG/ACT inhaler Inhale 2 puffs into the lungs every 6 (six) hours as needed for wheezing or shortness of breath. 04/19/19    Jene Every, MD  cefdinir (OMNICEF) 300 MG capsule Take 2 capsules (600 mg total) by mouth daily. 03/02/19   Bing Neighbors, FNP  predniSONE (DELTASONE) 50 MG tablet Take 1 tablet (50 mg total) by mouth daily with breakfast. 04/19/19   Jene Every, MD     Allergies Patient has no known allergies.  Family History  Problem Relation Age of Onset  . Depression Mother   . Obesity Mother   . Alcohol abuse Father   . Cirrhosis Father   . Cirrhosis Sister   . Alcohol abuse Brother   . Allergic rhinitis Daughter   . Allergic rhinitis Son   . Cancer Maternal Grandmother        Colon    Social History Social History   Tobacco Use  . Smoking status: Never Smoker  . Smokeless tobacco: Never Used  Substance Use Topics  . Alcohol use: No    Alcohol/week: 0.0 standard drinks  . Drug use: No    Review of Systems  Constitutional: No fever/chills Eyes: No visual changes.  ENT: No sore throat. Cardiovascular:as above Respiratory: as above Gastrointestinal: No abdominal pain.  No nausea, no vomiting.   Genitourinary: groin discomfort last week, resolved Musculoskeletal: Negative for back pain. Skin: Negative for rash. Neurological: Negative for headaches or weakness   ____________________________________________   PHYSICAL EXAM:  VITAL SIGNS: ED Triage Vitals  Enc Vitals Group     BP 04/19/19  1328 (!) 159/91     Pulse Rate 04/19/19 1328 (!) 105     Resp 04/19/19 1328 16     Temp 04/19/19 1328 97.6 F (36.4 C)     Temp Source 04/19/19 1328 Oral     SpO2 04/19/19 1328 99 %     Weight 04/19/19 1329 122.5 kg (270 lb)     Height 04/19/19 1329 1.905 m (6\' 3" )     Head Circumference --      Peak Flow --      Pain Score 04/19/19 1329 3     Pain Loc --      Pain Edu? --      Excl. in [place]? --     Constitutional: Alert and oriented.   Mouth/Throat: Mucous membranes are moist.   Neck:  Painless ROM Cardiovascular: Normal rate, regular rhythm. Grossly normal heart  sounds.  Good peripheral circulation. Respiratory: Normal respiratory effort.  No retractions. Lungs CTAB. Gastrointestinal: Soft and nontender. No distention.  No CVA tenderness.  Musculoskeletal: No lower extremity tenderness nor edema.  Warm and well perfused Neurologic:  Normal speech and language. No gross focal neurologic deficits are appreciated.  Skin:  Skin is warm, dry and intact. No rash noted. Psychiatric: Mood and affect are normal. Speech and behavior are normal.  ____________________________________________   LABS (all labs ordered are listed, but only abnormal results are displayed)  Labs Reviewed  BASIC METABOLIC PANEL - Abnormal; Notable for the following components:      Result Value   Glucose, Bld 144 (*)    All other components within normal limits  CBC  TROPONIN I (HIGH SENSITIVITY)  TROPONIN I (HIGH SENSITIVITY)   ____________________________________________  EKG  ED ECG REPORT I, Lavonia Drafts, the attending physician, personally viewed and interpreted this ECG.  Date: 04/19/2019  Rhythm: normal sinus rhythm QRS Axis: normal Intervals: normal ST/T Wave abnormalities: normal Narrative Interpretation: no evidence of acute ischemia  ____________________________________________  RADIOLOGY  cxr viewed by me, normal ____________________________________________   PROCEDURES  Procedure(s) performed: No  Procedures   Critical Care performed: No ____________________________________________   INITIAL IMPRESSION / ASSESSMENT AND PLAN / ED COURSE  Pertinent labs & imaging results that were available during my care of the patient were reviewed by me and considered in my medical decision making (see chart for details).  Patient overall well-appearing in no acute distress here in the emergency department.  Exam is reassuring possibly some faint wheezes on lung exam and given his HPI suggestive of bronchospasm.  Differential also includes ACS,  pneumonia.  However EKG and troponin are normal, chest x-ray is clear, afebrile, normal white blood cell count.  Reviewed past medical records.  I did offer CT of the chest to evaluate for PE although I noted that I found that to be very unlikely, the patient did decline.  I suspect element of bronchospasm given onset when exposure to cold dry air, will treat with albuterol, prednisone burst, close follow-up with PCP, return precautions discussed.    ____________________________________________   FINAL CLINICAL IMPRESSION(S) / ED DIAGNOSES  Final diagnoses:  Nonspecific chest pain  Bronchospasm        Note:  This document was prepared using Dragon voice recognition software and may include unintentional dictation errors.   Lavonia Drafts, MD 04/19/19 2049

## 2019-05-01 ENCOUNTER — Ambulatory Visit: Payer: BC Managed Care – PPO | Attending: Internal Medicine

## 2019-05-01 DIAGNOSIS — Z23 Encounter for immunization: Secondary | ICD-10-CM

## 2019-05-01 NOTE — Progress Notes (Signed)
   Covid-19 Vaccination Clinic  Name:  Jeffrey Schneider    MRN: [id number] DOB: 1970/12/06  05/01/2019  Mr. [name] was observed post Covid-19 immunization for 15 minutes without incident. He was provided with Vaccine Information Sheet and instruction to access the V-Safe system.   Mr. [name] was instructed to call 911 with any severe reactions post vaccine: Marland Kitchen Difficulty breathing  . Swelling of face and throat  . A fast heartbeat  . A bad rash all over body  . Dizziness and weakness   Immunizations Administered    Name Date Dose VIS Date Route   Pfizer COVID-19 Vaccine 05/01/2019 11:03 AM 0.3 mL 12/29/2018 Intramuscular   Manufacturer: [employer], [employer]   Lot: [id number]   NDC: [address]-3

## 2019-05-11 ENCOUNTER — Encounter: Payer: BC Managed Care – PPO | Admitting: Family Medicine

## 2019-05-18 ENCOUNTER — Ambulatory Visit (INDEPENDENT_AMBULATORY_CARE_PROVIDER_SITE_OTHER): Payer: BC Managed Care – PPO | Admitting: Family Medicine

## 2019-05-18 ENCOUNTER — Other Ambulatory Visit: Payer: Self-pay

## 2019-05-18 ENCOUNTER — Encounter: Payer: Self-pay | Admitting: Family Medicine

## 2019-05-18 VITALS — BP 128/74 | HR 100 | Temp 97.6°F | Resp 18 | Ht 74.0 in | Wt 273.5 lb

## 2019-05-18 DIAGNOSIS — F341 Dysthymic disorder: Secondary | ICD-10-CM

## 2019-05-18 DIAGNOSIS — E049 Nontoxic goiter, unspecified: Secondary | ICD-10-CM

## 2019-05-18 DIAGNOSIS — E8881 Metabolic syndrome: Secondary | ICD-10-CM

## 2019-05-18 DIAGNOSIS — E785 Hyperlipidemia, unspecified: Secondary | ICD-10-CM

## 2019-05-18 DIAGNOSIS — K219 Gastro-esophageal reflux disease without esophagitis: Secondary | ICD-10-CM

## 2019-05-18 DIAGNOSIS — N529 Male erectile dysfunction, unspecified: Secondary | ICD-10-CM

## 2019-05-18 DIAGNOSIS — Z0000 Encounter for general adult medical examination without abnormal findings: Secondary | ICD-10-CM

## 2019-05-18 DIAGNOSIS — R6882 Decreased libido: Secondary | ICD-10-CM

## 2019-05-18 NOTE — Progress Notes (Signed)
Name: Jeffrey Schneider   MRN: [id number]    DOB: [birth year]   Date:05/18/2019       Progress Note  Subjective  Chief Complaint  Chief Complaint  Patient presents with  . Annual Exam  . Follow-up    6 month    HPI  Patient presents for annual CPE and follow up  Chest discomfort: he had COVID-19 back in Feb, he has noticed some intermittent chest tightness and sob, went to [facility] 04/19/2019 , negative troponin, normal EKG, was given prednisone, Cefidinir and ventolin. Episode was preceded by walking in cold air. He is doing better, but still has episodes that he feels like he needs to take a deep breath. He is back playing basketball and was fine.   Goiter: thyroid US done in 2016 showed no nodules, mild enlargement, we will continue to monitor thyroid panel. . Denies dysphagia, change in bowel movement, or increase in dry skin.   GERD: he has a long history of GERD, he stopped taking Pepcid , he states he will try to cut down on caffeine. Discussed life style modification again, he is afraid of side effects. He can try Tums prn   Obesity:he lost a lot of weight when he had COVID but gaining it back but will try to stop gaining any more weight now . He started working out again, since he got his COVID vaccines. He is not at the gym yet   Hyperlipidemia: we will recheck labs   ED/decrease libido: going on for months, discussed being in the moment, we can try medication for ED if needed, but to talk to wife first, also discussed risk of testosterone supplementation   Dysthymia: phq 9 is high, he states his son is back in school, he took Fall semester off, doing better, he is now working out again. He worries about being a black man in [place] , also worries about his son  USPSTF grade A and B recommendations:  Diet: trying to eat healthy  Exercise: starting to get active again   Depression: phq 9 is positive Depression screen [facility] 2/9 05/18/2019 03/02/2019 02/26/2019 02/21/2019  11/10/2018  Decreased Interest 1 0 0 0 3  Down, Depressed, Hopeless 0 0 0 0 1  PHQ - 2 Score 1 0 0 0 4  Altered sleeping 0 0 0 0 1  Tired, decreased energy 1 0 0 0 3  Change in appetite 0 0 0 0 1  Feeling bad or failure about yourself  0 0 0 0 2  Trouble concentrating 1 0 0 0 1  Moving slowly or fidgety/restless 0 0 0 0 0  Suicidal thoughts 0 0 0 0 0  PHQ-9 Score 3 0 0 0 12  Difficult doing work/chores Not difficult at all Not difficult at all Not difficult at all Not difficult at all Not difficult at all  Some recent data might be hidden    Hypertension:  BP Readings from Last 3 Encounters:  05/18/19 128/74  04/19/19 (!) 159/91  03/09/19 130/80    Obesity: Wt Readings from Last 3 Encounters:  05/18/19 273 lb 8 oz (124.1 kg)  04/19/19 270 lb (122.5 kg)  03/09/19 270 lb 12 oz (122.8 kg)   BMI Readings from Last 3 Encounters:  05/18/19 35.12 kg/m  04/19/19 33.75 kg/m  03/09/19 34.76 kg/m     Lipids:  Lab Results  Component Value Date   CHOL 174 06/21/2018   CHOL 157 11/16/2017   CHOL 156 10/29/2016  Lab Results  Component Value Date   HDL 31 (L) 06/21/2018   HDL 36 (L) 11/16/2017   HDL 43 10/29/2016   Lab Results  Component Value Date   LDLCALC 99 06/21/2018   LDLCALC 99 11/16/2017   LDLCALC 97 10/29/2016   Lab Results  Component Value Date   TRIG 221 (H) 06/21/2018   TRIG 108 11/16/2017   TRIG 74 10/29/2016   Lab Results  Component Value Date   CHOLHDL 5.6 (H) 06/21/2018   CHOLHDL 4.4 11/16/2017   CHOLHDL 3.6 10/29/2016   No results found for: LDLDIRECT Glucose:  Glucose  Date Value Ref Range Status  06/21/2018 120 (H) 65 - 99 mg/dL Final  93/57/0177 939 (H) 65 - 99 mg/dL Final   Glucose, Bld  Date Value Ref Range Status  04/19/2019 144 (H) 70 - 99 mg/dL Final    Comment:    Glucose reference range applies only to samples taken after fasting for at least 8 hours.  10/29/2016 97 65 - 99 mg/dL Final    Comment:    .            Fasting  reference interval .   09/02/2015 97 65 - 99 mg/dL Final      [facility]/18/[facility] in [facility]  AUDIT-C Score  0       Married STD testing and prevention (HIV/chl/gon/syphilis): not interested  Hep C: 2020   Skin cancer: Discussed monitoring for atypical lesions Colorectal cancer: repeat at age 65 , last one was 03/11/2014 showed internal hemorrhoids  Prostate cancer: discussed USPTF  Lab Results  Component Value Date   PSA Normal, 0.5 02/21/2014    IPSS Questionnaire (AUA-7): Over the past month.   1)  How often have you had a sensation of not emptying your bladder completely after you finish urinating?  0 - Not at all  2)  How often have you had to urinate again less than two hours after you finished urinating? 0 - Not at all  3)  How often have you found you stopped and started again several times when you urinated?  0 - Not at all  4) How difficult have you found it to postpone urination?  0 - Not at all  5) How often have you had a weak urinary stream?  0 - Not at all  6) How often have you had to push or strain to begin urination?  0 - Not at all  7) How many times did you most typically get up to urinate from the time you went to bed until the time you got up in the morning?  1 - 1 time  Total score:  0-7 mildly symptomatic   8-19 moderately symptomatic   20-35 severely symptomatic     ECG:  04/19/2019   Advanced Care Planning: A voluntary discussion about advance care planning including the explanation and discussion of advance directives.  Discussed health care proxy and Living will, and the patient was able to identify a health care proxy as wife .  Patient does not have a living will at present time.  Patient Active Problem List   Diagnosis Date Noted  . Medial meniscus tear 11/08/2017  . Lateral meniscus tear 01/30/2016  . Bilateral knee pain 09/27/2014  . Dyslipidemia 09/24/2014  . Decreased libido 09/24/2014  . Goiter  09/24/2014  . Hemorrhoids, internal 09/24/2014  . Dysmetabolic syndrome 09/24/2014  . Obesity (BMI 30.0-34.9) 09/24/2014  . Osteoarthritis of  left knee 09/24/2014  . Abnormal blood sugar 11/25/2008  . Cervical pain 11/17/2006    Past Surgical History:  Procedure Laterality Date  . ACHILLES TENDON SURGERY Left    Past Rupture    Family History  Problem Relation Age of Onset  . Obesity Mother   . Schizophrenia Mother   . Atrial fibrillation Mother   . Diabetes Mother   . Alcohol abuse Father   . Cirrhosis Father   . Cirrhosis Sister   . Alcohol abuse Brother   . Allergic rhinitis Daughter   . Allergic rhinitis Son   . Cancer Maternal Grandmother        Colon    Social History   Socioeconomic History  . Marital status: Married    Spouse name: [name]  . Number of children: 2  . Years of education: Not on file  . Highest education level: Master's degree (e.g., MA, MS, MEng, MEd, MSW, MBA)  Occupational History  . Occupation: Associate Professor: [employer]  Tobacco Use  . Smoking status: Never Smoker  . Smokeless tobacco: Never Used  Substance and Sexual Activity  . Alcohol use: No    Alcohol/week: 0.0 standard drinks  . Drug use: No  . Sexual activity: Yes    Partners: Female  Other Topics Concern  . 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 Difficulty of Paying Living Expenses: Not hard at all  Food Insecurity: No Food Insecurity  . Worried About Programme researcher, broadcasting/film/video in the Last Year: Never true  . Ran Out of Food in the Last Year: Never true  Transportation Needs: No Transportation Needs  . Lack of Transportation (Medical): No  . Lack of Transportation (Non-Medical): No  Physical Activity: Sufficiently Active  . Days of Exercise per Week: 4 days  . Minutes of Exercise per  Session: 50 min  Stress: Stress Concern Present  . Feeling of Stress : To some extent  Social Connections: Somewhat Isolated  . Frequency of Communication with Friends and Family: Once a week  . Frequency of Social Gatherings with Friends and Family: Once a week  . Attends Religious Services: More than 4 times per year  . Active Member of Clubs or Organizations: No  . Attends Banker Meetings: Never  . Marital Status: Married  Catering manager Violence: Not At Risk  . Fear of Current or Ex-Partner: No  . Emotionally Abused: No  . Physically Abused: No  . Sexually Abused: No     Current Outpatient Medications:  .  albuterol (VENTOLIN HFA) 108 (90 Base) MCG/ACT inhaler, Inhale 2 puffs into the lungs every 6 (six) hours as needed for wheezing or shortness of breath. (Patient not taking: Reported on 05/18/2019), Disp: 6.7 g, Rfl: 0 .  cefdinir (OMNICEF) 300 MG capsule, Take 2 capsules (600 mg total) by mouth daily. (Patient not taking: Reported on 05/18/2019), Disp: 20 capsule, Rfl: 0 .  predniSONE (DELTASONE) 50 MG tablet, Take 1 tablet (50 mg total) by mouth daily with breakfast. (Patient not taking: Reported on 05/18/2019), Disp: 5 tablet, Rfl: 0  No Known Allergies   ROS  Constitutional: Negative for fever or weight change.  Respiratory: Negative for cough and shortness of breath.   Cardiovascular: Negative for chest  pain or palpitations.  Gastrointestinal: Negative for abdominal pain, no bowel changes.  Musculoskeletal: Negative for gait problem or joint swelling.  Skin: Negative for rash.  Neurological: Negative for dizziness or headache.  No other specific complaints in a complete review of systems (except as listed in HPI above).   Objective  Vitals:   05/18/19 0807  BP: 128/74  Pulse: 100  Resp: 18  Temp: 97.6 F (36.4 C)  TempSrc: Temporal  SpO2: 96%  Weight: 273 lb 8 oz (124.1 kg)  Height: 6\' 2"  (1.88 m)    Body mass index is 35.12  kg/m.  Physical Exam  Constitutional: Patient appears well-developed and obese  No distress.  HENT: Head: Normocephalic and atraumatic. Ears: B TMs ok, no erythema or effusion; Nose: not done Mouth/Throat: not done Eyes: Conjunctivae and EOM are normal. Pupils are equal, round, and reactive to light. No scleral icterus.  Neck: Normal range of motion. Neck supple. No JVD present. No thyromegaly present.  Cardiovascular: Normal rate, regular rhythm and normal heart sounds.  No murmur heard. No BLE edema. Pulmonary/Chest: Effort normal and breath sounds normal. No respiratory distress. Abdominal: Soft. Bowel sounds are normal, no distension. There is no tenderness. no masses MALE GENITALIA: Normal descended testes bilaterally, no masses palpated, no hernias, no lesions, no discharge RECTAL: not done Musculoskeletal: Normal range of motion, no joint effusions. No gross deformities Neurological: he is alert and oriented to person, place, and time. No cranial nerve deficit. Coordination, balance, strength, speech and gait are normal.  Skin: Skin is warm and dry. No rash noted. No erythema. 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 behavior is normal. Judgment and thought content normal.  Recent Results (from the past 2160 hour(s))  POC COVID-19 BinaxNow     Status: Abnormal   Collection Time: 02/20/19  3:50 PM  Result Value Ref Range   SARS Coronavirus 2 Ag Positive (A) Negative    Comment: Pt notified, he will notify his PCP Tattiana Fakhouri today for further guidance. Confidential Communicable Disease Report sent to [facility] via fax [phone].  Basic metabolic panel     Status: Abnormal   Collection Time: 04/19/19  1:32 PM  Result Value Ref Range   Sodium 138 135 - 145 mmol/L   Potassium 3.7 3.5 - 5.1 mmol/L   Chloride 102 98 - 111 mmol/L   CO2 23 22 - 32 mmol/L   Glucose, Bld 144 (H) 70 - 99 mg/dL     Comment: Glucose reference range applies only to samples taken after fasting for at least 8 hours.   BUN 15 6 - 20 mg/dL   Creatinine, Ser 06/19/19 0.61 - 1.24 mg/dL   Calcium 9.6 8.9 - 3.23 mg/dL   GFR calc non Af Amer >60 >60 mL/min   GFR calc Af Amer >60 >60 mL/min   Anion gap 13 5 - 15    Comment: Performed at [facility], [address] [place], [place]  CBC     Status: None   Collection Time: 04/19/19  1:32 PM  Result Value Ref Range   WBC 7.3 4.0 - 10.5 K/uL   RBC 5.03 4.22 - 5.81 MIL/uL   Hemoglobin 14.5 13.0 - 17.0 g/dL   HCT 06/19/19 54.2 - 70.6 %   MCV 83.9 80.0 - 100.0 fL   MCH 28.8 26.0 - 34.0 pg   MCHC 34.4 30.0 -  36.0 g/dL   RDW 16.113.3 09.611.5 - 04.515.5 %   Platelets 234 150 - 400 K/uL   nRBC 0.0 0.0 - 0.2 %    Comment: Performed at [facility], [address] [place], [place] [address]  Troponin I (High Sensitivity)     Status: None   Collection Time: 04/19/19  1:32 PM  Result Value Ref Range   Troponin I (High Sensitivity) 3 <18 ng/L    Comment: (NOTE) Elevated high sensitivity troponin I (hsTnI) values and significant  changes across serial measurements may suggest ACS but many other  chronic and acute conditions are known to elevate hsTnI results.  Refer to the "Links" section for chest pain algorithms and additional  guidance. Performed at [facility], [address] [place], [place] [address]      PHQ2/9: [facility] screen [facility] 05/18/2019 03/02/2019 02/26/2019 02/21/2019 11/10/2018  Decreased Interest 1 0 0 0 3  Down, Depressed, Hopeless 0 0 0 0 1  PHQ - 2 Score 1 0 0 0 4  Altered sleeping 0 0 0 0 1  Tired, decreased energy 1 0 0 0 3  Change in appetite 0 0 0 0 1  Feeling bad or failure about yourself  0 0 0 0 2  Trouble concentrating 1 0 0 0 1  Moving slowly or fidgety/restless 0 0 0 0 0  Suicidal thoughts 0 0 0 0 0  PHQ-9 Score 3 0 0 0 12  Difficult doing work/chores Not difficult at all Not difficult at all Not  difficult at all Not difficult at all Not difficult at all  Some recent data might be hidden     Fall Risk: Fall Risk  05/18/2019 03/02/2019 02/26/2019 02/21/2019 11/10/2018  Falls in the past year? 0 0 0 0 0  Number falls in past yr: 0 0 0 0 0  Comment - - - - -  Injury with Fall? 0 0 0 0 0  Follow up Falls evaluation completed Falls evaluation completed Falls evaluation completed Falls evaluation completed -     Functional Status Survey: Is the patient deaf or have difficulty hearing?: No Does the patient have difficulty seeing, even when wearing glasses/contacts?: No Does the patient have difficulty concentrating, remembering, or making decisions?: No Does the patient have difficulty walking or climbing stairs?: No Does the patient have difficulty dressing or bathing?: No Does the patient have difficulty doing errands alone such as visiting a doctor's office or shopping?: No   Assessment & Plan  1. Well adult exam  - CBC with Differential/Platelet - COMPLETE METABOLIC PANEL WITH GFR  2. Dysthymia  Doing well okay   3. Dysmetabolic syndrome  - Hemoglobin A1c  4. Dyslipidemia  - Lipid panel  5. Goiter  - TSH  6. GERD without esophagitis   7. Low libido  - Testosterone Total,Free,Bio, Males  8. Erectile dysfunction, unspecified erectile dysfunction type  - Testosterone Total,Free,Bio, Males  -Prostate cancer screening and PSA options (with potential risks and benefits of testing vs not testing) were discussed along with recent recs/guidelines. -USPSTF grade A and B recommendations reviewed with patient; age-appropriate recommendations, preventive care, screening tests, etc discussed and encouraged; healthy living encouraged; see AVS for patient education given to patient -Discussed importance of 150 minutes of physical activity weekly, eat two servings of fish weekly, eat one serving of tree nuts ( cashews, pistachios, pecans, almonds.Marland Kitchen.) every other day, eat 6  servings of fruit/vegetables daily and drink plenty of water and avoid  sweet beverages.

## 2019-05-18 NOTE — Patient Instructions (Signed)
Preventive Care 41-47 Years Old, Male Preventive care refers to lifestyle choices and visits with your health care provider that can promote health and wellness. This includes:  A yearly physical exam. This is also called an annual well check.  Regular dental and eye exams.  Immunizations.  Screening for certain conditions.  Healthy lifestyle choices, such as eating a healthy diet, getting regular exercise, not using drugs or products that contain nicotine and tobacco, and limiting alcohol use. What can I expect for my preventive care visit? Physical exam Your health care provider will check:  Height and weight. These may be used to calculate body mass index (BMI), which is a measurement that tells if you are at a healthy weight.  Heart rate and blood pressure.  Your skin for abnormal spots. Counseling Your health care provider may ask you questions about:  Alcohol, tobacco, and drug use.  Emotional well-being.  Home and relationship well-being.  Sexual activity.  Eating habits.  Work and work Statistician. What immunizations do I need?  Influenza (flu) vaccine  This is recommended every year. Tetanus, diphtheria, and pertussis (Tdap) vaccine  You may need a Td booster every 10 years. Varicella (chickenpox) vaccine  You may need this vaccine if you have not already been vaccinated. Zoster (shingles) vaccine  You may need this after age 47. Measles, mumps, and rubella (MMR) vaccine  You may need at least one dose of MMR if you were born in 1957 or later. You may also need a second dose. Pneumococcal conjugate (PCV13) vaccine  You may need this if you have certain conditions and were not previously vaccinated. Pneumococcal polysaccharide (PPSV23) vaccine  You may need one or two doses if you smoke cigarettes or if you have certain conditions. Meningococcal conjugate (MenACWY) vaccine  You may need this if you have certain conditions. Hepatitis A  vaccine  You may need this if you have certain conditions or if you travel or work in places where you may be exposed to hepatitis A. Hepatitis B vaccine  You may need this if you have certain conditions or if you travel or work in places where you may be exposed to hepatitis B. Haemophilus influenzae type b (Hib) vaccine  You may need this if you have certain risk factors. Human papillomavirus (HPV) vaccine  If recommended by your health care provider, you may need three doses over 6 months. You may receive vaccines as individual doses or as more than one vaccine together in one shot (combination vaccines). Talk with your health care provider about the risks and benefits of combination vaccines. What tests do I need? Blood tests  Lipid and cholesterol levels. These may be checked every 5 years, or more frequently if you are over 60 years old.  Hepatitis C test.  Hepatitis B test. Screening  Lung cancer screening. You may have this screening every year starting at age 47 if you have a 30-pack-year history of smoking and currently smoke or have quit within the past 15 years.  Prostate cancer screening. Recommendations will vary depending on your family history and other risks.  Colorectal cancer screening. All adults should have this screening starting at age 47 and continuing until age 47. Your health care provider may recommend screening at age 47 if you are at increased risk. You will have tests every 1-10 years, depending on your results and the type of screening test.  Diabetes screening. This is done by checking your blood sugar (glucose) after you have not eaten  for a while (fasting). You may have this done every 1-3 years.  Sexually transmitted disease (STD) testing. Follow these instructions at home: Eating and drinking  Eat a diet that includes fresh fruits and vegetables, whole grains, lean protein, and low-fat dairy products.  Take vitamin and mineral supplements as  recommended by your health care provider.  Do not drink alcohol if your health care provider tells you not to drink.  If you drink alcohol: ? Limit how much you have to 0-2 drinks a day. ? Be aware of how much alcohol is in your drink. In the [place] one drink equals one 12 oz bottle of beer (355 mL), one 5 oz glass of wine (148 mL), or one 1 oz glass of hard liquor (44 mL). Lifestyle  Take daily care of your teeth and gums.  Stay active. Exercise for at least 30 minutes on 5 or more days each week.  Do not use any products that contain nicotine or tobacco, such as cigarettes, e-cigarettes, and chewing tobacco. If you need help quitting, ask your health care provider.  If you are sexually active, practice safe sex. Use a condom or other form of protection to prevent STIs (sexually transmitted infections).  Talk with your health care provider about taking a low-dose aspirin every day starting at age 47. What's next?  Go to your health care provider once a year for a well check visit.  Ask your health care provider how often you should have your eyes and teeth checked.  Stay up to date on all vaccines. This information is not intended to replace advice given to you by your health care provider. Make sure you discuss any questions you have with your health care provider. Document Revised: 12/29/2017 Document Reviewed: 12/29/2017 Elsevier Patient Education  2020 [employer].

## 2019-05-24 ENCOUNTER — Other Ambulatory Visit: Payer: Self-pay

## 2019-05-24 ENCOUNTER — Other Ambulatory Visit: Payer: BC Managed Care – PPO

## 2019-05-24 DIAGNOSIS — E785 Hyperlipidemia, unspecified: Secondary | ICD-10-CM

## 2019-05-24 DIAGNOSIS — R739 Hyperglycemia, unspecified: Secondary | ICD-10-CM

## 2019-05-24 DIAGNOSIS — Z0000 Encounter for general adult medical examination without abnormal findings: Secondary | ICD-10-CM

## 2019-05-24 NOTE — Addendum Note (Signed)
Addended by: Jethro Bolus on: 05/24/2019 08:52 AM   Modules accepted: Orders

## 2019-05-25 LAB — COMPREHENSIVE METABOLIC PANEL
ALT: 27 IU/L (ref 0–44)
AST: 26 IU/L (ref 0–40)
Albumin/Globulin Ratio: 1.5 (ref 1.2–2.2)
Albumin: 4.6 g/dL (ref 4.0–5.0)
Alkaline Phosphatase: 96 IU/L (ref 39–117)
BUN/Creatinine Ratio: 14 (ref 9–20)
BUN: 15 mg/dL (ref 6–24)
Bilirubin Total: 0.5 mg/dL (ref 0.0–1.2)
CO2: 20 mmol/L (ref 20–29)
Calcium: 9.9 mg/dL (ref 8.7–10.2)
Chloride: 104 mmol/L (ref 96–106)
Creatinine, Ser: 1.11 mg/dL (ref 0.76–1.27)
GFR calc Af Amer: 92 mL/min/{1.73_m2} (ref >59)
GFR calc non Af Amer: 79 mL/min/{1.73_m2} (ref >59)
Globulin, Total: 3.1 g/dL (ref 1.5–4.5)
Glucose: 159 mg/dL — ABNORMAL HIGH (ref 65–99)
Potassium: 4.3 mmol/L (ref 3.5–5.2)
Sodium: 141 mmol/L (ref 134–144)
Total Protein: 7.7 g/dL (ref 6.0–8.5)

## 2019-05-25 LAB — THYROID PANEL WITH TSH
Free Thyroxine Index: 1.9 (ref 1.2–4.9)
T3 Uptake Ratio: 29 % (ref 24–39)
T4, Total: 6.5 ug/dL (ref 4.5–12.0)
TSH: 1.73 u[IU]/mL (ref 0.450–4.500)

## 2019-05-25 LAB — CBC WITH DIFFERENTIAL/PLATELET
Basophils Absolute: 0 10*3/uL (ref 0.0–0.2)
Basos: 0 %
EOS (ABSOLUTE): 0.1 10*3/uL (ref 0.0–0.4)
Eos: 1 %
Hematocrit: 42.2 % (ref 37.5–51.0)
Hemoglobin: 14.3 g/dL (ref 13.0–17.7)
Immature Grans (Abs): 0 10*3/uL (ref 0.0–0.1)
Immature Granulocytes: 0 %
Lymphocytes Absolute: 2 10*3/uL (ref 0.7–3.1)
Lymphs: 26 %
MCH: 29 pg (ref 26.6–33.0)
MCHC: 33.9 g/dL (ref 31.5–35.7)
MCV: 86 fL (ref 79–97)
Monocytes Absolute: 0.5 10*3/uL (ref 0.1–0.9)
Monocytes: 6 %
Neutrophils Absolute: 5 10*3/uL (ref 1.4–7.0)
Neutrophils: 67 %
Platelets: 239 10*3/uL (ref 150–450)
RBC: 4.93 x10E6/uL (ref 4.14–5.80)
RDW: 13.9 % (ref 11.6–15.4)
WBC: 7.6 10*3/uL (ref 3.4–10.8)

## 2019-05-25 LAB — HEMOGLOBIN A1C
Est. average glucose Bld gHb Est-mCnc: 189 mg/dL
Hgb A1c MFr Bld: 8.2 % — ABNORMAL HIGH (ref 4.8–5.6)

## 2019-05-25 LAB — LIPID PANEL WITH LDL/HDL RATIO
Cholesterol, Total: 191 mg/dL (ref 100–199)
HDL: 35 mg/dL — ABNORMAL LOW (ref >39)
LDL Chol Calc (NIH): 123 mg/dL — ABNORMAL HIGH (ref 0–99)
LDL/HDL Ratio: 3.5 ratio (ref 0.0–3.6)
Triglycerides: 184 mg/dL — ABNORMAL HIGH (ref 0–149)
VLDL Cholesterol Cal: 33 mg/dL (ref 5–40)

## 2019-05-26 ENCOUNTER — Encounter: Payer: Self-pay | Admitting: Family Medicine

## 2019-05-26 DIAGNOSIS — E1169 Type 2 diabetes mellitus with other specified complication: Secondary | ICD-10-CM | POA: Insufficient documentation

## 2019-05-27 ENCOUNTER — Encounter: Payer: Self-pay | Admitting: Family Medicine

## 2019-06-19 ENCOUNTER — Other Ambulatory Visit: Payer: Self-pay

## 2019-06-19 ENCOUNTER — Encounter: Payer: Self-pay | Admitting: Family Medicine

## 2019-06-19 ENCOUNTER — Ambulatory Visit: Payer: BC Managed Care – PPO | Admitting: Family Medicine

## 2019-06-19 VITALS — BP 124/82 | HR 94 | Temp 98.2°F | Resp 18 | Ht 74.0 in | Wt 262.6 lb

## 2019-06-19 DIAGNOSIS — Z23 Encounter for immunization: Secondary | ICD-10-CM | POA: Diagnosis not present

## 2019-06-19 DIAGNOSIS — E1169 Type 2 diabetes mellitus with other specified complication: Secondary | ICD-10-CM

## 2019-06-19 DIAGNOSIS — E785 Hyperlipidemia, unspecified: Secondary | ICD-10-CM

## 2019-06-19 DIAGNOSIS — E669 Obesity, unspecified: Secondary | ICD-10-CM | POA: Diagnosis not present

## 2019-06-19 DIAGNOSIS — F341 Dysthymic disorder: Secondary | ICD-10-CM

## 2019-06-19 MED ORDER — ROSUVASTATIN CALCIUM 5 MG PO TABS: 5.0000 mg | ORAL_TABLET | Freq: Every day | ORAL | 0 refills | Status: DC

## 2019-06-19 MED ORDER — ASPIRIN EC 81 MG PO TBEC: 81.0000 mg | DELAYED_RELEASE_TABLET | Freq: Every day | ORAL | 0 refills | Status: DC

## 2019-06-19 NOTE — Progress Notes (Signed)
Name: Jeffrey Schneider   MRN: [id number]    DOB: 21-Jun-1970   Date:06/19/2019       Progress Note  Subjective  Chief Complaint  Chief Complaint  Patient presents with  . Diabetes    management    HPI  Diabetes type II : he has a long history of pre-diabetes, on 05/24/2019 his A1C went up to 8.2 %, fasting glucose was elevated at 159. He has obesity and hyperlipidemia associated DM. He came in today to discuss therapy and learn more about diabetes. He bought a glucose meter and initially glucose was elevated in the 176 fasting, but has improved . He states highest of 202 . We spent time discussed dietary options and substitution. He has lost some weight   The 10-year ASCVD risk score Mikey Bussing DC Jr., et al., 2013) is: 8.4%   Values used to calculate the score:     Age: 49 years     Sex: Male     Is Non-Hispanic African American: Yes     Diabetic: Yes     Tobacco smoker: No     Systolic Blood Pressure: 891 mmHg     Is BP treated: No     HDL Cholesterol: 35 mg/dL     Total Cholesterol: 191 mg/dL   Dysthymia: he is feeling a little down, upset about diagnosis, also worried about his son  Patient Active Problem List   Diagnosis Date Noted  . Diabetes mellitus type 2 in obese ([facility]) 05/26/2019  . Medial meniscus tear 11/08/2017  . Lateral meniscus tear 01/30/2016  . Bilateral knee pain 09/27/2014  . Dyslipidemia 09/24/2014  . Decreased libido 09/24/2014  . Goiter 09/24/2014  . Hemorrhoids, internal 09/24/2014  . Dysmetabolic syndrome 69/45/0388  . Obesity (BMI 30.0-34.9) 09/24/2014  . Osteoarthritis of left knee 09/24/2014  . Abnormal blood sugar 11/25/2008  . Cervical pain 11/17/2006    Past Surgical History:  Procedure Laterality Date  . ACHILLES TENDON SURGERY Left    Past Rupture    Family History  Problem Relation Age of Onset  . Obesity Mother   . Schizophrenia Mother   . Atrial fibrillation Mother   . Diabetes Mother   . Alcohol abuse Father   . Cirrhosis  Father   . Cirrhosis Sister   . Alcohol abuse Brother   . Allergic rhinitis Daughter   . Allergic rhinitis Son   . Cancer Maternal Grandmother        Colon    Social History   Tobacco Use  . Smoking status: Never Smoker  . Smokeless tobacco: Never Used  Substance Use Topics  . Alcohol use: No    Alcohol/week: 0.0 standard drinks     Current Outpatient Medications:  .  aspirin EC 81 MG tablet, Take 1 tablet (81 mg total) by mouth daily., Disp: 30 tablet, Rfl: 0 .  rosuvastatin (CRESTOR) 5 MG tablet, Take 1 tablet (5 mg total) by mouth daily. With coQ10 to prevent cramping, Disp: 90 tablet, Rfl: 0  No Known Allergies  I personally reviewed active problem list, medication list, allergies, family history, social history, health maintenance with the patient/caregiver today.   ROS  Constitutional: Negative for fever or weight change.  Respiratory: Negative for cough and shortness of breath.   Cardiovascular: Negative for chest pain or palpitations.  Gastrointestinal: Negative for abdominal pain, no bowel changes.  Musculoskeletal: Negative for gait problem or joint swelling.  Skin: Negative for rash.  Neurological: Negative for dizziness or headache.  No other specific complaints in a complete review of systems (except as listed in HPI above).  Objective  Vitals:   06/19/19 0824  BP: 124/82  Pulse: 94  Resp: 18  Temp: 98.2 F (36.8 C)  TempSrc: Temporal  SpO2: 99%  Weight: 262 lb 9.6 oz (119.1 kg)  Height: _0  (1.88 m)    Body mass index is 33.72 kg/m.  Physical Exam  Constitutional: Patient appears well-developed and well-nourished. Obese  No distress.  HEENT: head atraumatic, normocephalic, pupils equal and reactive to light,  neck supple Cardiovascular: Normal rate, regular rhythm and normal heart sounds.  No murmur heard. No BLE edema. Pulmonary/Chest: Effort normal and breath sounds normal. No respiratory distress. Abdominal: Soft.  There is no  tenderness. Psychiatric: Patient has a normal mood and affect. behavior is normal. Judgment and thought content normal.  Recent Results (from the past 2160 hour(s))  Basic metabolic panel     Status: Abnormal   Collection Time: 04/19/19  1:32 PM  Result Value Ref Range   Sodium 138 135 - 145 mmol/L   Potassium 3.7 3.5 - 5.1 mmol/L   Chloride 102 98 - 111 mmol/L   CO2 23 22 - 32 mmol/L   Glucose, Bld 144 (H) 70 - 99 mg/dL    Comment: Glucose reference range applies only to samples taken after fasting for at least 8 hours.   BUN 15 6 - 20 mg/dL   Creatinine, Ser 1.11 0.61 - 1.24 mg/dL   Calcium 9.6 8.9 - 10.3 mg/dL   GFR calc non Af Amer >60 >60 mL/min   GFR calc Af Amer >60 >60 mL/min   Anion gap 13 5 - 15    Comment: Performed at [facility], [address] [place], [place] [address]  CBC     Status: None   Collection Time: 04/19/19  1:32 PM  Result Value Ref Range   WBC 7.3 4.0 - 10.5 K/uL   RBC 5.03 4.22 - 5.81 MIL/uL   Hemoglobin 14.5 13.0 - 17.0 g/dL   HCT 42.2 39.0 - 52.0 %   MCV 83.9 80.0 - 100.0 fL   MCH 28.8 26.0 - 34.0 pg   MCHC 34.4 30.0 - 36.0 g/dL   RDW 13.3 11.5 - 15.5 %   Platelets 234 150 - 400 K/uL   nRBC 0.0 0.0 - 0.2 %    Comment: Performed at [facility], [address] [place], [place] [address]  Troponin I (High Sensitivity)     Status: None   Collection Time: 04/19/19  1:32 PM  Result Value Ref Range   Troponin I (High Sensitivity) 3 <18 ng/L    Comment: (NOTE) Elevated high sensitivity troponin I (hsTnI) values and significant  changes across serial measurements may suggest ACS but many other  chronic and acute conditions are known to elevate hsTnI results.  Refer to the "Links" section for chest pain algorithms and additional  guidance. Performed at [facility], [address] [place], [place] [address]   Thyroid Panel With TSH     Status: None   Collection Time: 05/24/19  8:52 AM  Result Value Ref Range    TSH 1.730 0.450 - 4.500 uIU/mL   T4, Total 6.5 4.5 - 12.0 ug/dL   T3 Uptake Ratio 29 24 - 39 %   Free Thyroxine Index 1.9 1.2 - 4.9  HgB A1c     Status: Abnormal   Collection Time: 05/24/19  8:52 AM  Result Value  Ref Range   Hgb A1c MFr Bld 8.2 (H) 4.8 - 5.6 %    Comment:          Prediabetes: 5.7 - 6.4          Diabetes: >6.4          Glycemic control for adults with diabetes: <7.0    Est. average glucose Bld gHb Est-mCnc 189 mg/dL  Comprehensive metabolic panel     Status: Abnormal   Collection Time: 05/24/19  8:52 AM  Result Value Ref Range   Glucose 159 (H) 65 - 99 mg/dL   BUN 15 6 - 24 mg/dL   Creatinine, Ser 1.11 0.76 - 1.27 mg/dL   GFR calc non Af Amer 79 >59 mL/min/1.73   GFR calc Af Amer 92 >59 mL/min/1.73    Comment: **[facility] currently reports eGFR in compliance with the current**   recommendations of the [employer]. [facility] will   update reporting as new guidelines are published from the NKF-ASN   Task force.    BUN/Creatinine Ratio 14 9 - 20   Sodium 141 134 - 144 mmol/L   Potassium 4.3 3.5 - 5.2 mmol/L   Chloride 104 96 - 106 mmol/L   CO2 20 20 - 29 mmol/L   Calcium 9.9 8.7 - 10.2 mg/dL   Total Protein 7.7 6.0 - 8.5 g/dL   Albumin 4.6 4.0 - 5.0 g/dL   Globulin, Total 3.1 1.5 - 4.5 g/dL   Albumin/Globulin Ratio 1.5 1.2 - 2.2   Bilirubin Total 0.5 0.0 - 1.2 mg/dL   Alkaline Phosphatase 96 39 - 117 IU/L   AST 26 0 - 40 IU/L   ALT 27 0 - 44 IU/L  CBC with Differential/Platelet     Status: None   Collection Time: 05/24/19  8:52 AM  Result Value Ref Range   WBC 7.6 3.4 - 10.8 x10E3/uL   RBC 4.93 4.14 - 5.80 x10E6/uL   Hemoglobin 14.3 13.0 - 17.7 g/dL   Hematocrit 42.2 37.5 - 51.0 %   MCV 86 79 - 97 fL   MCH 29.0 26.6 - 33.0 pg   MCHC 33.9 31.5 - 35.7 g/dL   RDW 13.9 11.6 - 15.4 %   Platelets 239 150 - 450 x10E3/uL   Neutrophils 67 Not Estab. %   Lymphs 26 Not Estab. %   Monocytes 6 Not Estab. %   Eos 1 Not Estab. %   Basos 0 Not Estab. %    Neutrophils Absolute 5.0 1.4 - 7.0 x10E3/uL   Lymphocytes Absolute 2.0 0.7 - 3.1 x10E3/uL   Monocytes Absolute 0.5 0.1 - 0.9 x10E3/uL   EOS (ABSOLUTE) 0.1 0.0 - 0.4 x10E3/uL   Basophils Absolute 0.0 0.0 - 0.2 x10E3/uL   Immature Granulocytes 0 Not Estab. %   Immature Grans (Abs) 0.0 0.0 - 0.1 x10E3/uL  Lipid Panel With LDL/HDL Ratio     Status: Abnormal   Collection Time: 05/24/19  8:52 AM  Result Value Ref Range   Cholesterol, Total 191 100 - 199 mg/dL   Triglycerides 184 (H) 0 - 149 mg/dL   HDL 35 (L) >39 mg/dL   VLDL Cholesterol Cal 33 5 - 40 mg/dL   LDL Chol Calc (NIH) 123 (H) 0 - 99 mg/dL   LDL/HDL Ratio 3.5 0.0 - 3.6 ratio    Comment:  LDL/HDL Ratio                                             Men  Women                               1/2 Avg.Risk  1.0    1.5                                   Avg.Risk  3.6    3.2                                2X Avg.Risk  6.2    5.0                                3X Avg.Risk  8.0    6.1     Diabetic Foot Exam: Diabetic Foot Exam - Simple   Simple Foot Form Diabetic Foot exam was performed with the following findings: Yes 06/19/2019  9:12 AM  Visual Inspection See comments: Yes Sensation Testing Intact to touch and monofilament testing bilaterally: Yes Pulse Check Posterior Tibialis and Dorsalis pulse intact bilaterally: Yes Comments Dry feet, flat feet, damaged 2nd toenail on both feet  from basketball injury      PHQ2/9: Depression screen [facility] 2/9 06/19/2019 05/18/2019 03/02/2019 02/26/2019 02/21/2019  Decreased Interest 2 1 0 0 0  Down, Depressed, Hopeless 1 0 0 0 0  PHQ - 2 Score 3 1 0 0 0  Altered sleeping 0 0 0 0 0  Tired, decreased energy 1 1 0 0 0  Change in appetite 0 0 0 0 0  Feeling bad or failure about yourself  0 0 0 0 0  Trouble concentrating 0 1 0 0 0  Moving slowly or fidgety/restless 0 0 0 0 0  Suicidal thoughts 0 0 0 0 0  PHQ-9 Score 4 3 0 0 0  Difficult doing work/chores Not  difficult at all Not difficult at all Not difficult at all Not difficult at all Not difficult at all  Some recent data might be hidden    phq 9 is positive   Fall Risk: Fall Risk  06/19/2019 05/18/2019 03/02/2019 02/26/2019 02/21/2019  Falls in the past year? 0 0 0 0 0  Number falls in past yr: - 0 0 0 0  Comment - - - - -  Injury with Fall? - 0 0 0 0  Follow up - Falls evaluation completed Falls evaluation completed Falls evaluation completed Falls evaluation completed     Functional Status Survey: Is the patient deaf or have difficulty hearing?: No Does the patient have difficulty seeing, even when wearing glasses/contacts?: No Does the patient have difficulty concentrating, remembering, or making decisions?: No Does the patient have difficulty walking or climbing stairs?: No Does the patient have difficulty dressing or bathing?: No Does the patient have difficulty doing errands alone such as visiting a doctor's office or shopping?: No    Assessment & Plan  1. Diabetes mellitus type 2 in obese ([facility])  - rosuvastatin (CRESTOR) 5 MG tablet; Take 1 tablet (5 mg total)  by mouth daily. With coQ10 to prevent cramping  Dispense: 90 tablet; Refill: 0 - aspirin EC 81 MG tablet; Take 1 tablet (81 mg total) by mouth daily.  Dispense: 30 tablet; Refill: 0 - Microalbumin / creatinine urine ratio - Amb ref to Medical Nutrition Therapy-[facility]  2. Dyslipidemia associated with type 2 diabetes mellitus ([facility])  - rosuvastatin (CRESTOR) 5 MG tablet; Take 1 tablet (5 mg total) by mouth daily. With coQ10 to prevent cramping  Dispense: 90 tablet; Refill: 0 - aspirin EC 81 MG tablet; Take 1 tablet (81 mg total) by mouth daily.  Dispense: 30 tablet; Refill: 0 - Amb ref to Medical Nutrition Therapy-[facility]  3. Need for vaccination for pneumococcus  - Pneumococcal polysaccharide vaccine 23-valent greater than or equal to 2yo subcutaneous/IM  4. Dysthymia  He does not want medication

## 2019-06-20 LAB — MICROALBUMIN / CREATININE URINE RATIO
Creatinine, Urine: 210 mg/dL (ref 20–320)
Microalb Creat Ratio: 1 mcg/mg creat (ref <30)
Microalb, Ur: 0.2 mg/dL

## 2019-07-10 ENCOUNTER — Encounter: Payer: BC Managed Care – PPO | Attending: Family Medicine | Admitting: *Deleted

## 2019-07-10 ENCOUNTER — Encounter: Payer: Self-pay | Admitting: *Deleted

## 2019-07-10 ENCOUNTER — Other Ambulatory Visit: Payer: Self-pay

## 2019-07-10 VITALS — BP 120/74 | Ht 74.0 in | Wt 247.6 lb

## 2019-07-10 DIAGNOSIS — Z6831 Body mass index (BMI) 31.0-31.9, adult: Secondary | ICD-10-CM | POA: Insufficient documentation

## 2019-07-10 DIAGNOSIS — Z713 Dietary counseling and surveillance: Secondary | ICD-10-CM | POA: Insufficient documentation

## 2019-07-10 DIAGNOSIS — E785 Hyperlipidemia, unspecified: Secondary | ICD-10-CM | POA: Insufficient documentation

## 2019-07-10 DIAGNOSIS — E669 Obesity, unspecified: Secondary | ICD-10-CM | POA: Insufficient documentation

## 2019-07-10 DIAGNOSIS — E119 Type 2 diabetes mellitus without complications: Secondary | ICD-10-CM

## 2019-07-10 DIAGNOSIS — E1169 Type 2 diabetes mellitus with other specified complication: Secondary | ICD-10-CM | POA: Diagnosis not present

## 2019-07-10 NOTE — Patient Instructions (Signed)
Check blood sugars 1 x day before breakfast or 2 hrs after one meal every day Bring blood sugar records to the next class  Exercise: Continue program for 60-120 minutes  6 days a week  Eat 3 meals day, 2 snacks a day Space meals 4-6 hours apart  Make an eye doctor appointment  Return for classes on:

## 2019-07-11 ENCOUNTER — Other Ambulatory Visit: Payer: Self-pay | Admitting: Family Medicine

## 2019-07-11 DIAGNOSIS — E1169 Type 2 diabetes mellitus with other specified complication: Secondary | ICD-10-CM

## 2019-07-11 NOTE — Progress Notes (Signed)
Diabetes Self-Management Education  Visit Type: First/Initial  Appt. Start Time:1540  Appt. End Time: 1640  07/10/2019  [name], identified by name and date of birth, is a 49 y.o. male with a diagnosis of Diabetes: Type 2.   ASSESSMENT  Blood pressure 120/74, height 6\' 2"  (1.88 m), weight 247 lb 9.6 oz (112.3 kg). Body mass index is 31.79 kg/m.   Diabetes Self-Management Education - 07/10/19 1646      Visit Information   Visit Type First/Initial      Initial Visit   Diabetes Type Type 2    Are you currently following a meal plan? Yes    What type of meal plan do you follow? "more vegetables, protein, no breads, no sweets, sugar free drinks on week-ends"    Are you taking your medications as prescribed? Yes    Date Diagnosed 1 month ago      Health Coping   How would you rate your overall health? Excellent      Psychosocial Assessment   Patient Belief/Attitude about Diabetes Motivated to manage diabetes   "stressful"   Self-care barriers None    Self-management support Family    Other persons present Spouse/SO    Patient Concerns Nutrition/Meal planning;Glycemic Control;Weight Control;Healthy Lifestyle    Special Needs None    Preferred Learning Style Visual;Other (comment)   talking, discussion   Learning Readiness Change in progress    How often do you need to have someone help you when you read instructions, pamphlets, or other written materials from your doctor or pharmacy? 1 - Never    What is the last grade level you completed in school? grad school      Pre-Education Assessment   Patient understands the diabetes disease and treatment process. Needs Instruction    Patient understands incorporating nutritional management into lifestyle. Needs Instruction    Patient undertands incorporating physical activity into lifestyle. Demonstrates understanding / competency    Patient understands using medications safely. Needs Instruction    Patient understands  monitoring blood glucose, interpreting and using results Needs Review    Patient understands prevention, detection, and treatment of acute complications. Needs Instruction    Patient understands prevention, detection, and treatment of chronic complications. Needs Instruction    Patient understands how to develop strategies to address psychosocial issues. Needs Instruction    Patient understands how to develop strategies to promote health/change behavior. Needs Instruction      Complications   Last HgB A1C per patient/outside source 8.2 %   05/24/2019   How often do you check your blood sugar? 1-2 times/day    Fasting Blood glucose range (mg/dL) 70-129   FBG's 86-113 mg/dL   Postprandial Blood glucose range (mg/dL) 70-129   pp's 86-121 mg/dL   Have you had a dilated eye exam in the past 12 months? No    Have you had a dental exam in the past 12 months? Yes    Are you checking your feet? No      Dietary Intake   Breakfast eggs, omelet with spinach, [place] sausage, onions, occasonal fruit - blueberries, apple    Snack (morning) nuts    Lunch salad; protein shake - Atkins or Boost, apple    Snack (afternoon) apple    Dinner chicken, fish, [place] burger - green beans, lettuce, tomatoes, carrots, cuccumbers, zucchini, squash, cabbage, greens. spinach wraps with lettuce, [place] and cheese    Beverage(s) water, sugar free Gatorade, Propel      Exercise  Exercise Type Strenuous (running)   basketball, running, elliptical, weight training   How many days per week to you exercise? 6    How many minutes per day do you exercise? 90    Total minutes per week of exercise 540      Patient Education   Previous Diabetes Education No    Disease state  Definition of diabetes, type 1 and 2, and the diagnosis of diabetes;Factors that contribute to the development of diabetes    Nutrition management  Role of diet in the treatment of diabetes and the relationship between the three main macronutrients and  blood glucose level;Food label reading, portion sizes and measuring food.;Reviewed blood glucose goals for pre and post meals and how to evaluate the patients' food intake on their blood glucose level.    Physical activity and exercise  Role of exercise on diabetes management, blood pressure control and cardiac health.    Monitoring Purpose and frequency of SMBG.;Taught/discussed recording of test results and interpretation of SMBG.;Identified appropriate SMBG and/or A1C goals.    Chronic complications Relationship between chronic complications and blood glucose control    Psychosocial adjustment Role of stress on diabetes;Identified and addressed patients feelings and concerns about diabetes      Individualized Goals (developed by patient)   Reducing Risk Other (comment)   improve blood sugars, lose weight, lead a healthier lifestyle, become more fit     Outcomes   Expected Outcomes Demonstrated interest in learning. Expect positive outcomes    Future DMSE 4-6 wks           Individualized Plan for Diabetes Self-Management Training:   Learning Objective:  Patient will have a greater understanding of diabetes self-management. Patient education plan is to attend individual and/or group sessions per assessed needs and concerns.   Plan:   Patient Instructions  Check blood sugars 1 x day before breakfast or 2 hrs after one meal every day Bring blood sugar records to the next class Exercise: Continue program for 60-120 minutes  6 days a week Eat 3 meals day, 2 snacks a day Space meals 4-6 hours apart Make an eye doctor appointment  Expected Outcomes:  Demonstrated interest in learning. Expect positive outcomes  Education material provided:  General Meal Planning Guidelines Simple Meal Plan  If problems or questions, patient to contact team via:  Sharion Settler, RN, CCM, CDCES [phone]  Future DSME appointment: 4-6 wks  August 06, 2019 for Diabetes Class 1

## 2019-07-27 ENCOUNTER — Telehealth: Payer: Self-pay | Admitting: *Deleted

## 2019-07-27 NOTE — Telephone Encounter (Signed)
Received call from patient. He was concerned about a blood sugar of 79. He reported that he worked out, ate lunch and checked his blood sugar. He had protein - meat balls, [place] breast, broccoli and carrots but no carbohydrates. Reviewed meal planning and instructed him to eat at least 1 carbohydrate serving with meals. Reviewed foods that are considered carbohydrates. He denied any symptoms of hypoglycemia. He is not taking any diabetes medications. We also reviewed ADA guidelines for blood sugars fasting, before meals and 2 hrs after meals. He reports most readings are 80's and 90's. Due to his schedule he is able to exercise before lunch. He is scheduled for diabetes classes beginning July 19. Instructed him to call back for any questions.

## 2019-07-31 ENCOUNTER — Encounter: Payer: Self-pay | Admitting: Family Medicine

## 2019-08-06 ENCOUNTER — Other Ambulatory Visit: Payer: Self-pay

## 2019-08-06 ENCOUNTER — Encounter: Payer: BC Managed Care – PPO | Attending: Family Medicine | Admitting: Dietician

## 2019-08-06 ENCOUNTER — Encounter: Payer: Self-pay | Admitting: Dietician

## 2019-08-06 VITALS — Ht 74.0 in | Wt 238.9 lb

## 2019-08-06 DIAGNOSIS — Z713 Dietary counseling and surveillance: Secondary | ICD-10-CM | POA: Diagnosis not present

## 2019-08-06 DIAGNOSIS — E119 Type 2 diabetes mellitus without complications: Secondary | ICD-10-CM

## 2019-08-06 DIAGNOSIS — Z6831 Body mass index (BMI) 31.0-31.9, adult: Secondary | ICD-10-CM | POA: Diagnosis not present

## 2019-08-06 DIAGNOSIS — E1169 Type 2 diabetes mellitus with other specified complication: Secondary | ICD-10-CM | POA: Insufficient documentation

## 2019-08-06 DIAGNOSIS — E669 Obesity, unspecified: Secondary | ICD-10-CM | POA: Insufficient documentation

## 2019-08-06 DIAGNOSIS — E785 Hyperlipidemia, unspecified: Secondary | ICD-10-CM | POA: Insufficient documentation

## 2019-08-06 NOTE — Progress Notes (Signed)

## 2019-08-08 NOTE — Progress Notes (Signed)
Not my patient, Closing

## 2019-08-13 ENCOUNTER — Encounter: Payer: Self-pay | Admitting: *Deleted

## 2019-08-13 ENCOUNTER — Other Ambulatory Visit: Payer: Self-pay

## 2019-08-13 ENCOUNTER — Encounter: Payer: BC Managed Care – PPO | Admitting: *Deleted

## 2019-08-13 VITALS — Wt 233.1 lb

## 2019-08-13 DIAGNOSIS — E785 Hyperlipidemia, unspecified: Secondary | ICD-10-CM | POA: Diagnosis not present

## 2019-08-13 DIAGNOSIS — E669 Obesity, unspecified: Secondary | ICD-10-CM | POA: Diagnosis not present

## 2019-08-13 DIAGNOSIS — E1169 Type 2 diabetes mellitus with other specified complication: Secondary | ICD-10-CM | POA: Diagnosis not present

## 2019-08-13 DIAGNOSIS — Z6831 Body mass index (BMI) 31.0-31.9, adult: Secondary | ICD-10-CM | POA: Diagnosis not present

## 2019-08-13 DIAGNOSIS — Z713 Dietary counseling and surveillance: Secondary | ICD-10-CM | POA: Diagnosis not present

## 2019-08-13 DIAGNOSIS — E119 Type 2 diabetes mellitus without complications: Secondary | ICD-10-CM

## 2019-08-13 NOTE — Progress Notes (Signed)

## 2019-08-20 ENCOUNTER — Encounter: Payer: BC Managed Care – PPO | Attending: Family Medicine | Admitting: Dietician

## 2019-08-20 ENCOUNTER — Encounter: Payer: Self-pay | Admitting: Dietician

## 2019-08-20 ENCOUNTER — Other Ambulatory Visit: Payer: Self-pay

## 2019-08-20 VITALS — BP 124/74 | Ht 74.0 in | Wt 232.8 lb

## 2019-08-20 DIAGNOSIS — Z6831 Body mass index (BMI) 31.0-31.9, adult: Secondary | ICD-10-CM | POA: Insufficient documentation

## 2019-08-20 DIAGNOSIS — E669 Obesity, unspecified: Secondary | ICD-10-CM | POA: Diagnosis not present

## 2019-08-20 DIAGNOSIS — E785 Hyperlipidemia, unspecified: Secondary | ICD-10-CM | POA: Insufficient documentation

## 2019-08-20 DIAGNOSIS — Z713 Dietary counseling and surveillance: Secondary | ICD-10-CM | POA: Insufficient documentation

## 2019-08-20 DIAGNOSIS — E1169 Type 2 diabetes mellitus with other specified complication: Secondary | ICD-10-CM | POA: Insufficient documentation

## 2019-08-20 DIAGNOSIS — E119 Type 2 diabetes mellitus without complications: Secondary | ICD-10-CM

## 2019-08-20 NOTE — Progress Notes (Signed)

## 2019-08-27 ENCOUNTER — Encounter: Payer: Self-pay | Admitting: *Deleted

## 2019-09-10 ENCOUNTER — Other Ambulatory Visit: Payer: Self-pay | Admitting: Family Medicine

## 2019-09-10 DIAGNOSIS — E1169 Type 2 diabetes mellitus with other specified complication: Secondary | ICD-10-CM

## 2019-09-18 NOTE — Progress Notes (Signed)
Name: Jeffrey Schneider   MRN: [id number]    DOB: December 06, 1970   Date:09/19/2019       Progress Note  Subjective  Chief Complaint  Chief Complaint  Patient presents with  . Diabetes  . Dyslipidemia  . Dysthymia    HPI  Diabetes type II : he has a long history of pre-diabetes, on 05/24/2019 his A1C went up to 8.2 %, fasting glucose was elevated at 159. He had obesity and hyperlipidemia associated DM. He is doing great since diagnosis, he has lost 47 lbs since April ( he states he is able to lose weight like this and has done it before) , no longer obese, BMI is the overweight range, he has been working out more, eating healthier and glucose average has been 80-90, it went down to 61 after activity once, discussed eating a banana if needed prior to activity. He is doing great A1C down to 5.7 %   Dyslipidemia: he is on medication now, he is eating healthier , avoiding fast food   The 10-year ASCVD risk score Denman George DC Jr., et al., 2013) is: 6.2%   Values used to calculate the score:     Age: 48 years     Sex: Male     Is Non-Hispanic African American: Yes     Diabetic: Yes     Tobacco smoker: No     Systolic Blood Pressure: 104 mmHg     Is BP treated: No     HDL Cholesterol: 35 mg/dL     Total Cholesterol: 191 mg/dL  Dysthymia: he worries about his mother, she is 41 yo has schizophrenia , she is in a facility but he worries about her, he is trying to get her moved to closer to them. He feels guilty because he is not near by and siblings not helping out. Discussed therapy   Bradycardia: he states his Fitbit shows resting heart rate around 58, he denies associated dizziness, chest pain or palpitation. He has been very physically active about 100 minutes per day on average, gave him reassurance   Patient Active Problem List   Diagnosis Date Noted  . Diabetes mellitus type 2 in obese (HCC) 05/26/2019  . Medial meniscus tear 11/08/2017  . Lateral meniscus tear 01/30/2016  . Bilateral  knee pain 09/27/2014  . Dyslipidemia 09/24/2014  . Decreased libido 09/24/2014  . Goiter 09/24/2014  . Hemorrhoids, internal 09/24/2014  . Dysmetabolic syndrome 09/24/2014  . Obesity (BMI 30.0-34.9) 09/24/2014  . Osteoarthritis of left knee 09/24/2014  . Abnormal blood sugar 11/25/2008  . Cervical pain 11/17/2006    Past Surgical History:  Procedure Laterality Date  . ACHILLES TENDON SURGERY Left    Past Rupture    Family History  Problem Relation Age of Onset  . Obesity Mother   . Schizophrenia Mother   . Atrial fibrillation Mother   . Diabetes Mother   . Alcohol abuse Father   . Cirrhosis Father   . Cirrhosis Sister   . Alcohol abuse Brother   . Allergic rhinitis Daughter   . Allergic rhinitis Son   . Cancer Maternal Grandmother        Colon    Social History   Tobacco Use  . Smoking status: Never Smoker  . Smokeless tobacco: Never Used  Substance Use Topics  . Alcohol use: No    Alcohol/week: 0.0 standard drinks     Current Outpatient Medications:  .  ASPIRIN LOW DOSE 81 MG EC tablet, TAKE 1 TABLET  BY MOUTH EVERY DAY, Disp: 90 tablet, Rfl: 1 .  rosuvastatin (CRESTOR) 5 MG tablet, TAKE 1 TABLET (5 MG TOTAL) BY MOUTH DAILY. WITH COQ10 TO PREVENT CRAMPING, Disp: 90 tablet, Rfl: 0  No Known Allergies  I personally reviewed active problem list, medication list, allergies, family history, social history, health maintenance with the patient/caregiver today.   ROS  Constitutional: Negative for fever, positive for  weight change.  Respiratory: Negative for cough and shortness of breath.   Cardiovascular: Negative for chest pain or palpitations.  Gastrointestinal: Negative for abdominal pain, no bowel changes.  Musculoskeletal: Negative for gait problem or joint swelling.  Skin: Negative for rash.  Neurological: Negative for dizziness or headache.  No other specific complaints in a complete review of systems (except as listed in HPI  above).  Objective  Vitals:   09/19/19 0840  BP: 104/70  Pulse: 83  Resp: 16  Temp: 98 F (36.7 C)  TempSrc: Oral  SpO2: 99%  Weight: 226 lb 11.2 oz (102.8 kg)  Height: 6\' 2"  (1.88 m)    Body mass index is 29.11 kg/m.  Physical Exam  Constitutional: Patient appears well-developed and well-nourished. Obese  No distress.  HEENT: head atraumatic, normocephalic, pupils equal and reactive to light, neck supple, oral mucosa not done  Cardiovascular: Normal rate, regular rhythm and normal heart sounds.  No murmur heard. No BLE edema. Pulmonary/Chest: Effort normal and breath sounds normal. No respiratory distress. Abdominal: Soft.  There is no tenderness. Psychiatric: Patient has a normal mood and affect. behavior is normal. Judgment and thought content normal.  Recent Results (from the past 2160 hour(s))  POCT HgB A1C     Status: Abnormal   Collection Time: 09/19/19  8:58 AM  Result Value Ref Range   Hemoglobin A1C 5.7 (A) 4.0 - 5.6 %   HbA1c POC (<> result, manual entry)     HbA1c, POC (prediabetic range)     HbA1c, POC (controlled diabetic range)       PHQ2/9: Depression screen [facility] 2/9 09/19/2019 07/10/2019 06/19/2019 05/18/2019 03/02/2019  Decreased Interest 0 0 2 1 0  Down, Depressed, Hopeless 1 0 1 0 0  PHQ - 2 Score 1 0 3 1 0  Altered sleeping - - 0 0 0  Tired, decreased energy - - 1 1 0  Change in appetite - - 0 0 0  Feeling bad or failure about yourself  - - 0 0 0  Trouble concentrating - - 0 1 0  Moving slowly or fidgety/restless - - 0 0 0  Suicidal thoughts - - 0 0 0  PHQ-9 Score - - 4 3 0  Difficult doing work/chores - - Not difficult at all Not difficult at all Not difficult at all  Some recent data might be hidden    phq 9 is negative   Fall Risk: Fall Risk  09/19/2019 08/20/2019 08/13/2019 08/06/2019 07/10/2019  Falls in the past year? 0 0 0 0 0  Number falls in past yr: 0 - - - -  Comment - - - - -  Injury with Fall? 0 - - - -  Follow up - - - - -       Functional Status Survey: Is the patient deaf or have difficulty hearing?: No Does the patient have difficulty seeing, even when wearing glasses/contacts?: No Does the patient have difficulty concentrating, remembering, or making decisions?: No Does the patient have difficulty walking or climbing stairs?: Yes Does the patient have difficulty dressing or  bathing?: No Does the patient have difficulty doing errands alone such as visiting a doctor's office or shopping?: No    Assessment & Plan   1. Dyslipidemia associated with type 2 diabetes mellitus (HCC)  - POCT HgB A1C  2. Dysthymia  He will think about counseling   3. Dyslipidemia  Recheck level

## 2019-09-19 ENCOUNTER — Ambulatory Visit: Payer: BC Managed Care – PPO | Admitting: Family Medicine

## 2019-09-19 ENCOUNTER — Other Ambulatory Visit: Payer: Self-pay

## 2019-09-19 ENCOUNTER — Encounter: Payer: Self-pay | Admitting: Family Medicine

## 2019-09-19 VITALS — BP 104/70 | HR 83 | Temp 98.0°F | Resp 16 | Ht 74.0 in | Wt 226.7 lb

## 2019-09-19 DIAGNOSIS — E1169 Type 2 diabetes mellitus with other specified complication: Secondary | ICD-10-CM

## 2019-09-19 DIAGNOSIS — Z23 Encounter for immunization: Secondary | ICD-10-CM

## 2019-09-19 DIAGNOSIS — F341 Dysthymic disorder: Secondary | ICD-10-CM | POA: Diagnosis not present

## 2019-09-19 DIAGNOSIS — E785 Hyperlipidemia, unspecified: Secondary | ICD-10-CM | POA: Diagnosis not present

## 2019-09-19 LAB — POCT GLYCOSYLATED HEMOGLOBIN (HGB A1C): Hemoglobin A1C: 5.7 % — AB (ref 4.0–5.6)

## 2019-09-20 LAB — COMPLETE METABOLIC PANEL WITH GFR
AG Ratio: 1.7 (calc) (ref 1.0–2.5)
ALT: 15 U/L (ref 9–46)
AST: 14 U/L (ref 10–40)
Albumin: 4.3 g/dL (ref 3.6–5.1)
Alkaline phosphatase (APISO): 71 U/L (ref 36–130)
BUN: 19 mg/dL (ref 7–25)
CO2: 27 mmol/L (ref 20–32)
Calcium: 9.7 mg/dL (ref 8.6–10.3)
Chloride: 107 mmol/L (ref 98–110)
Creat: 1.18 mg/dL (ref 0.60–1.35)
GFR, Est African American: 85 mL/min/{1.73_m2} (ref >=60)
GFR, Est Non African American: 74 mL/min/{1.73_m2} (ref >=60)
Globulin: 2.6 g/dL (calc) (ref 1.9–3.7)
Glucose, Bld: 84 mg/dL (ref 65–99)
Potassium: 4.2 mmol/L (ref 3.5–5.3)
Sodium: 142 mmol/L (ref 135–146)
Total Bilirubin: 0.5 mg/dL (ref 0.2–1.2)
Total Protein: 6.9 g/dL (ref 6.1–8.1)

## 2019-09-20 LAB — LIPID PANEL
Cholesterol: 102 mg/dL (ref <200)
HDL: 36 mg/dL — ABNORMAL LOW (ref >=40)
LDL Cholesterol (Calc): 51 mg/dL (calc)
Non-HDL Cholesterol (Calc): 66 mg/dL (calc) (ref <130)
Total CHOL/HDL Ratio: 2.8 (calc) (ref <5.0)
Triglycerides: 66 mg/dL (ref <150)

## 2019-10-13 LAB — HM DIABETES EYE EXAM

## 2019-11-19 ENCOUNTER — Ambulatory Visit: Payer: BC Managed Care – PPO | Admitting: Family Medicine

## 2019-11-23 NOTE — Progress Notes (Signed)
Sign encounter

## 2019-12-02 ENCOUNTER — Other Ambulatory Visit: Payer: Self-pay | Admitting: Family Medicine

## 2019-12-02 DIAGNOSIS — E1169 Type 2 diabetes mellitus with other specified complication: Secondary | ICD-10-CM

## 2019-12-02 DIAGNOSIS — E669 Obesity, unspecified: Secondary | ICD-10-CM

## 2019-12-03 MED ORDER — ROSUVASTATIN CALCIUM 5 MG PO TABS: 5.0000 mg | ORAL_TABLET | Freq: Every day | ORAL | 0 refills | Status: DC

## 2019-12-17 ENCOUNTER — Other Ambulatory Visit: Payer: Self-pay | Admitting: Family Medicine

## 2019-12-17 DIAGNOSIS — E669 Obesity, unspecified: Secondary | ICD-10-CM

## 2019-12-17 DIAGNOSIS — E785 Hyperlipidemia, unspecified: Secondary | ICD-10-CM

## 2019-12-17 DIAGNOSIS — E1169 Type 2 diabetes mellitus with other specified complication: Secondary | ICD-10-CM

## 2019-12-17 NOTE — Telephone Encounter (Signed)
rosuvastatin (CRESTOR) 5 MG tablet Medication Date: 12/03/2019 Department: [facility] Ordering/Authorizing: Alba Cory, MD   [facility] [phone] [facility], [place] - [address] Phone:  [phone]  Fax:  [phone]     Pt went out of town to [place] and forgot his meds, want to see if can get refill

## 2020-03-18 NOTE — Progress Notes (Signed)
Name: Jeffrey Schneider   MRN: [id number]    DOB: [birth year]   Date:03/19/2020       Progress Note  Subjective  Chief Complaint  Follow up   HPI   Diabetes type II : he has a long history of pre-diabetes, on 05/24/2019 his A1C went up to 8.2 %, fasting glucose was elevated at 159. He had obesity and hyperlipidemia associated DM. He is doing great since diagnosis, he had lost 47 lbs but he has gained a little bit back, today weight is up to 234 lbs, but he is still eating a carbohydrate restrictive diet . A1C last visit was 5.7 % and today is normal at 5.4 %. He states fasting glucose 80-114. He is taking Rosuvastatin and last was at goal   Dyslipidemia: he is on medication now, he is eating healthier , avoiding fast food , last LDL was at goal   Dysthymia: he worries about his mother, she is 77 yo has schizophrenia , she is back at home, and she seems to be doing better, he is feeling well. phq 9 is back to normal   Bradycardia: he states his Fitbit shows resting heart rate around 58, he denies associated dizziness, chest pain or palpitation. He has been very physically active, he is exercising frequently, going to the gym twice a day, 30-40 minutes at lunch and also at the end of the day , plus plays basketball with friends on Saturdays  GERD: symptoms resolved with weight loss.   Patient Active Problem List   Diagnosis Date Noted  . Diabetes mellitus type 2 in obese (HCC) 05/26/2019  . Medial meniscus tear 11/08/2017  . Lateral meniscus tear 01/30/2016  . Bilateral knee pain 09/27/2014  . Dyslipidemia 09/24/2014  . Hemorrhoids, internal 09/24/2014  . Dysmetabolic syndrome 09/24/2014  . Obesity (BMI 30.0-34.9) 09/24/2014  . Osteoarthritis of left knee 09/24/2014  . Abnormal blood sugar 11/25/2008    Past Surgical History:  Procedure Laterality Date  . ACHILLES TENDON SURGERY Left    Past Rupture    Family History  Problem Relation Age of Onset  . Obesity Mother   .  Schizophrenia Mother   . Atrial fibrillation Mother   . Diabetes Mother   . Alcohol abuse Father   . Cirrhosis Father   . Cirrhosis Sister   . Alcohol abuse Brother   . Allergic rhinitis Daughter   . Allergic rhinitis Son   . Cancer Maternal Grandmother        Colon    Social History   Tobacco Use  . Smoking status: Never Smoker  . Smokeless tobacco: Never Used  Substance Use Topics  . Alcohol use: No    Alcohol/week: 0.0 standard drinks     Current Outpatient Medications:  .  rosuvastatin (CRESTOR) 5 MG tablet, TAKE 1 TABLET (5 MG TOTAL) BY MOUTH DAILY. WITH COQ10 TO PREVENT CRAMPING, Disp: 90 tablet, Rfl: 0  No Known Allergies  I personally reviewed active problem list, medication list, allergies, family history, social history, health maintenance with the patient/caregiver today.   ROS  Constitutional: Negative for fever or significant weight change.  Respiratory: Negative for cough and shortness of breath.   Cardiovascular: Negative for chest pain or palpitations.  Gastrointestinal: Negative for abdominal pain, no bowel changes.  Musculoskeletal: Negative for gait problem or joint swelling.  Skin: Negative for rash.  Neurological: Negative for dizziness or headache.  No other specific complaints in a complete review of systems (except as listed in  HPI above).  Objective  Vitals:   03/19/20 0832  BP: 116/70  Pulse: 77  Resp: 16  Temp: 98.1 F (36.7 C)  TempSrc: Oral  SpO2: 99%  Weight: 234 lb 9.6 oz (106.4 kg)  Height: 6\' 2"  (1.88 m)    Body mass index is 30.12 kg/m.  Physical Exam  Constitutional: Patient appears well-developed and well-nourished. Obese  No distress.  HEENT: head atraumatic, normocephalic, pupils equal and reactive to light,neck supple Cardiovascular: Normal rate, regular rhythm and normal heart sounds.  No murmur heard. No BLE edema. Pulmonary/Chest: Effort normal and breath sounds normal. No respiratory distress. Abdominal:  Soft.  There is no tenderness. Psychiatric: Patient has a normal mood and affect. behavior is normal. Judgment and thought content normal.  Recent Results (from the past 2160 hour(s))  POCT HgB A1C     Status: Normal   Collection Time: 03/19/20  8:50 AM  Result Value Ref Range   Hemoglobin A1C 5.4 4.0 - 5.6 %   HbA1c POC (<> result, manual entry)     HbA1c, POC (prediabetic range)     HbA1c, POC (controlled diabetic range)      PHQ2/9: Depression screen [facility] 2/9 03/19/2020 09/19/2019 07/10/2019 06/19/2019 05/18/2019  Decreased Interest 0 0 0 2 1  Down, Depressed, Hopeless 0 1 0 1 0  PHQ - 2 Score 0 1 0 3 1  Altered sleeping - - - 0 0  Tired, decreased energy - - - 1 1  Change in appetite - - - 0 0  Feeling bad or failure about yourself  - - - 0 0  Trouble concentrating - - - 0 1  Moving slowly or fidgety/restless - - - 0 0  Suicidal thoughts - - - 0 0  PHQ-9 Score - - - 4 3  Difficult doing work/chores - - - Not difficult at all Not difficult at all  Some recent data might be hidden    phq 9 is negative   Fall Risk: Fall Risk  03/19/2020 09/19/2019 08/20/2019 08/13/2019 08/06/2019  Falls in the past year? 0 0 0 0 0  Number falls in past yr: 0 0 - - -  Comment - - - - -  Injury with Fall? 0 0 - - -  Follow up - - - - -    Functional Status Survey: Is the patient deaf or have difficulty hearing?: No Does the patient have difficulty seeing, even when wearing glasses/contacts?: No Does the patient have difficulty concentrating, remembering, or making decisions?: No Does the patient have difficulty walking or climbing stairs?: Yes Does the patient have difficulty dressing or bathing?: No Does the patient have difficulty doing errands alone such as visiting a doctor's office or shopping?: No    Assessment & Plan  1. Dyslipidemia associated with type 2 diabetes mellitus (HCC)  - POCT HgB A1C - rosuvastatin (CRESTOR) 5 MG tablet; Take 1 tablet (5 mg total) by mouth daily. With coQ10 to  prevent cramping  Dispense: 90 tablet; Refill: 1  2. GERD without esophagitis   3. Dyslipidemia   4. Dysthymia  Doing well   5. Obesity (BMI 30.0-34.9)  Discussed with the patient the risk posed by an increased BMI. Discussed importance of portion control, calorie counting and at least 150 minutes of physical activity weekly. Avoid sweet beverages and drink more water. Eat at least 6 servings of fruit and vegetables daily   6. Bradycardia  Stable   7. Diabetes mellitus type 2 in obese (  HCC)  - rosuvastatin (CRESTOR) 5 MG tablet; Take 1 tablet (5 mg total) by mouth daily. With coQ10 to prevent cramping  Dispense: 90 tablet; Refill: 1

## 2020-03-19 ENCOUNTER — Other Ambulatory Visit: Payer: Self-pay

## 2020-03-19 ENCOUNTER — Encounter: Payer: Self-pay | Admitting: Family Medicine

## 2020-03-19 ENCOUNTER — Ambulatory Visit: Payer: BC Managed Care – PPO | Admitting: Family Medicine

## 2020-03-19 VITALS — BP 116/70 | HR 77 | Temp 98.1°F | Resp 16 | Ht 74.0 in | Wt 234.6 lb

## 2020-03-19 DIAGNOSIS — F341 Dysthymic disorder: Secondary | ICD-10-CM

## 2020-03-19 DIAGNOSIS — E1169 Type 2 diabetes mellitus with other specified complication: Secondary | ICD-10-CM | POA: Diagnosis not present

## 2020-03-19 DIAGNOSIS — E785 Hyperlipidemia, unspecified: Secondary | ICD-10-CM | POA: Diagnosis not present

## 2020-03-19 DIAGNOSIS — K219 Gastro-esophageal reflux disease without esophagitis: Secondary | ICD-10-CM

## 2020-03-19 DIAGNOSIS — R001 Bradycardia, unspecified: Secondary | ICD-10-CM

## 2020-03-19 DIAGNOSIS — E669 Obesity, unspecified: Secondary | ICD-10-CM

## 2020-03-19 LAB — POCT GLYCOSYLATED HEMOGLOBIN (HGB A1C): Hemoglobin A1C: 5.4 % (ref 4.0–5.6)

## 2020-03-19 MED ORDER — ROSUVASTATIN CALCIUM 5 MG PO TABS: 5.0000 mg | ORAL_TABLET | Freq: Every day | ORAL | 1 refills | Status: DC

## 2020-04-07 ENCOUNTER — Encounter: Payer: Self-pay | Admitting: *Deleted

## 2020-06-17 ENCOUNTER — Encounter: Payer: Self-pay | Admitting: Family Medicine

## 2020-07-01 NOTE — Patient Instructions (Addendum)
Preventive Care 40-48 Years Old, Male Preventive care refers to lifestyle choices and visits with your health care provider that can promote health and wellness. This includes: A yearly physical exam. This is also called an annual wellness visit. Regular dental and eye exams. Immunizations. Screening for certain conditions. Healthy lifestyle choices, such as: Eating a healthy diet. Getting regular exercise. Not using drugs or products that contain nicotine and tobacco. Limiting alcohol use. What can I expect for my preventive care visit? Physical exam Your health care provider will check your: Height and weight. These may be used to calculate your BMI (body mass index). BMI is a measurement that tells if you are at a healthy weight. Heart rate and blood pressure. Body temperature. Skin for abnormal spots. Counseling Your health care provider may ask you questions about your: Past medical problems. Family's medical history. Alcohol, tobacco, and drug use. Emotional well-being. Home life and relationship well-being. Sexual activity. Diet, exercise, and sleep habits. Work and work environment. Access to firearms. What immunizations do I need?  Vaccines are usually given at various ages, according to a schedule. Your health care provider will recommend vaccines for you based on your age, medicalhistory, and lifestyle or other factors, such as travel or where you work. What tests do I need? Blood tests Lipid and cholesterol levels. These may be checked every 5 years, or more often if you are over 50 years old. Hepatitis C test. Hepatitis B test. Screening Lung cancer screening. You may have this screening every year starting at age 55 if you have a 30-pack-year history of smoking and currently smoke or have quit within the past 15 years. Prostate cancer screening. Recommendations will vary depending on your family history and other risks. Genital exam to check for testicular cancer  or hernias. Colorectal cancer screening. All adults should have this screening starting at age 50 and continuing until age 75. Your health care provider may recommend screening at age 45 if you are at increased risk. You will have tests every 1-10 years, depending on your results and the type of screening test. Diabetes screening. This is done by checking your blood sugar (glucose) after you have not eaten for a while (fasting). You may have this done every 1-3 years. STD (sexually transmitted disease) testing, if you are at risk. Follow these instructions at home: Eating and drinking  Eat a diet that includes fresh fruits and vegetables, whole grains, lean protein, and low-fat dairy products. Take vitamin and mineral supplements as recommended by your health care provider. Do not drink alcohol if your health care provider tells you not to drink. If you drink alcohol: Limit how much you have to 0-2 drinks a day. Be aware of how much alcohol is in your drink. In the [place] one drink equals one 12 oz bottle of beer (355 mL), one 5 oz glass of wine (148 mL), or one 1 oz glass of hard liquor (44 mL).  Lifestyle Take daily care of your teeth and gums. Brush your teeth every morning and night with fluoride toothpaste. Floss one time each day. Stay active. Exercise for at least 30 minutes 5 or more days each week. Do not use any products that contain nicotine or tobacco, such as cigarettes, e-cigarettes, and chewing tobacco. If you need help quitting, ask your health care provider. Do not use drugs. If you are sexually active, practice safe sex. Use a condom or other form of protection to prevent STIs (sexually transmitted infections). If told by   your health care provider, take low-dose aspirin daily starting at age 81. Find healthy ways to cope with stress, such as: Meditation, yoga, or listening to music. Journaling. Talking to a trusted person. Spending time with friends and  family. Safety Always wear your seat belt while driving or riding in a vehicle. Do not drive: If you have been drinking alcohol. Do not ride with someone who has been drinking. When you are tired or distracted. While texting. Wear a helmet and other protective equipment during sports activities. If you have firearms in your house, make sure you follow all gun safety procedures. What's next? Go to your health care provider once a year for an annual wellness visit. Ask your health care provider how often you should have your eyes and teeth checked. Stay up to date on all vaccines. This information is not intended to replace advice given to you by your health care provider. Make sure you discuss any questions you have with your healthcare provider. Document Revised: 10/03/2018 Document Reviewed: 12/29/2017 Elsevier Patient Education  2022 [employer] Carpal Tunnel Syndrome  Carpal tunnel syndrome is a condition that causes pain, numbness, and weakness in your hand and fingers. The carpal tunnel is a narrow area located on the palm side of your wrist. Repeated wrist motion or certain diseases may cause swelling within the tunnel. This swelling pinches the main nerve in the wrist.The main nerve in the wrist is called the median nerve. What are the causes? This condition may be caused by: Repeated and forceful wrist and hand motions. Wrist injuries. Arthritis. A cyst or tumor in the carpal tunnel. Fluid buildup during pregnancy. Use of tools that vibrate. Sometimes the cause of this condition is not known. What increases the risk? The following factors may make you more likely to develop this condition: Having a job that requires you to repeatedly or forcefully move your wrist or hand or requires you to use tools that vibrate. This may include jobs that involve using computers, working on an [employer], or working with power tools such as Radiographer, therapeutic. Being a woman. Having  certain conditions, such as: Diabetes. Obesity. An underactive thyroid (hypothyroidism). Kidney failure. Rheumatoid arthritis. What are the signs or symptoms? Symptoms of this condition include: A tingling feeling in your fingers, especially in your thumb, index, and middle fingers. Tingling or numbness in your hand. An aching feeling in your entire arm, especially when your wrist and elbow are bent for a long time. Wrist pain that goes up your arm to your shoulder. Pain that goes down into your palm or fingers. A weak feeling in your hands. You may have trouble grabbing and holding items. Your symptoms may feel worse during the night. How is this diagnosed? This condition is diagnosed with a medical history and physical exam. You may also have tests, including: Electromyogram (EMG). This test measures electrical signals sent by your nerves into the muscles. Nerve conduction study. This test measures how well electrical signals pass through your nerves. Imaging tests, such as X-rays, ultrasound, and MRI. These tests check for possible causes of your condition. How is this treated? This condition may be treated with: Lifestyle changes. It is important to stop or change the activity that caused your condition. Doing exercise and activities to strengthen and stretch your muscles and tendons (physical therapy). Making lifestyle changes to help with your condition and learning how to do your daily activities safely (occupational therapy). Medicines for pain and inflammation. This may include  medicine that is injected into your wrist. A wrist splint or brace. Surgery. Follow these instructions at home: If you have a splint or brace: Wear the splint or brace as told by your health care provider. Remove it only as told by your health care provider. Loosen the splint or brace if your fingers tingle, become numb, or turn cold and blue. Keep the splint or brace clean. If the splint or brace is  not waterproof: Do not let it get wet. Cover it with a watertight covering when you take a bath or shower. Managing pain, stiffness, and swelling If directed, put ice on the painful area. To do this: If you have a removeable splint or brace, remove it as told by your health care provider. Put ice in a plastic bag. Place a towel between your skin and the bag or between the splint or brace and the bag. Leave the ice on for 20 minutes, 2-3 times a day. Do not fall asleep with the cold pack on your skin. Remove the ice if your skin turns bright red. This is very important. If you cannot feel pain, heat, or cold, you have a greater risk of damage to the area. Move your fingers often to reduce stiffness and swelling. General instructions Take over-the-counter and prescription medicines only as told by your health care provider. Rest your wrist and hand from any activity that may be causing your pain. If your condition is work related, talk with your employer about changes that can be made, such as getting a wrist pad to use while typing. Do any exercises as told by your health care provider, physical therapist, or occupational therapist. Keep all follow-up visits. This is important. Contact a health care provider if: You have new symptoms. Your pain is not controlled with medicines. Your symptoms get worse. Get help right away if: You have severe numbness or tingling in your wrist or hand. Summary Carpal tunnel syndrome is a condition that causes pain, numbness, and weakness in your hand and fingers. It is usually caused by repeated wrist motions. Lifestyle changes and medicines are used to treat carpal tunnel syndrome. Surgery may be recommended. Follow your health care provider's instructions about wearing a splint, resting from activity, keeping follow-up visits, and calling for help. This information is not intended to replace advice given to you by your health care provider. Make sure you  discuss any questions you have with your healthcare provider. Document Revised: 05/17/2019 Document Reviewed: 05/17/2019 Elsevier Patient Education  2022 [employer].

## 2020-07-01 NOTE — Progress Notes (Signed)
Name: Jeffrey Schneider   MRN: [id number]    DOB: 1971-01-06   Date:07/02/2020       Progress Note  Subjective  Chief Complaint  Annual Exam   HPI  Patient presents for annual CPE   IPSS Questionnaire (AUA-7): Over the past month.   1)  How often have you had a sensation of not emptying your bladder completely after you finish urinating?  0 - Not at all  2)  How often have you had to urinate again less than two hours after you finished urinating? 0 - Not at all  3)  How often have you found you stopped and started again several times when you urinated?  0 - Not at all  4) How difficult have you found it to postpone urination?  0 - Not at all  5) How often have you had a weak urinary stream?  0 - Not at all  6) How often have you had to push or strain to begin urination?  0 - Not at all  7) How many times did you most typically get up to urinate from the time you went to bed until the time you got up in the morning?  1 - 1 time  Total score:  0-7 mildly symptomatic   8-19 moderately symptomatic   20-35 severely symptomatic     Diet: balanced , following a diabetic diet  Exercise: 30 minutes M-F, also plays basketball on Saturdays   Depression: phq 9 is negative Depression screen [facility] 2/9 07/02/2020 03/19/2020 09/19/2019 07/10/2019 06/19/2019  Decreased Interest 1 0 0 0 2  Down, Depressed, Hopeless 1 0 1 0 1  PHQ - 2 Score 2 0 1 0 3  Altered sleeping 3 - - - 0  Tired, decreased energy 1 - - - 1  Change in appetite 0 - - - 0  Feeling bad or failure about yourself  0 - - - 0  Trouble concentrating 0 - - - 0  Moving slowly or fidgety/restless 0 - - - 0  Suicidal thoughts 0 - - - 0  PHQ-9 Score 6 - - - 4  Difficult doing work/chores - - - - Not difficult at all  Some recent data might be hidden    Hypertension:  BP Readings from Last 3 Encounters:  07/02/20 112/70  03/19/20 116/70  09/19/19 104/70    Obesity: Wt Readings from Last 3 Encounters:  07/02/20 238 lb (108 kg)   03/19/20 234 lb 9.6 oz (106.4 kg)  09/19/19 226 lb 11.2 oz (102.8 kg)   BMI Readings from Last 3 Encounters:  07/02/20 30.56 kg/m  03/19/20 30.12 kg/m  09/19/19 29.11 kg/m     Lipids:  Lab Results  Component Value Date   CHOL 102 09/19/2019   CHOL 191 05/24/2019   CHOL 174 06/21/2018   Lab Results  Component Value Date   HDL 36 (L) 09/19/2019   HDL 35 (L) 05/24/2019   HDL 31 (L) 06/21/2018   Lab Results  Component Value Date   LDLCALC 51 09/19/2019   LDLCALC 123 (H) 05/24/2019   LDLCALC 99 06/21/2018   Lab Results  Component Value Date   TRIG 66 09/19/2019   TRIG 184 (H) 05/24/2019   TRIG 221 (H) 06/21/2018   Lab Results  Component Value Date   CHOLHDL 2.8 09/19/2019   CHOLHDL 5.6 (H) 06/21/2018   CHOLHDL 4.4 11/16/2017   No results found for: LDLDIRECT Glucose:  Glucose  Date Value Ref Range  Status  05/24/2019 159 (H) 65 - 99 mg/dL Final  83/15/1761 607 (H) 65 - 99 mg/dL Final  37/10/6267 485 (H) 65 - 99 mg/dL Final   Glucose, Bld  Date Value Ref Range Status  09/19/2019 84 65 - 99 mg/dL Final    Comment:    .            Fasting reference interval .   04/19/2019 144 (H) 70 - 99 mg/dL Final    Comment:    Glucose reference range applies only to samples taken after fasting for at least 8 hours.  10/29/2016 97 65 - 99 mg/dL Final    Comment:    .            Fasting reference interval .     Flowsheet Row Office Visit from 07/02/2020 in [facility]  AUDIT-C Score 0        Married STD testing and prevention (HIV/chl/gon/syphilis): not interested  Hep C: 06/21/2018  Skin cancer: Discussed monitoring for atypical lesions Colorectal cancer: 03/11/2014 Prostate cancer: N/A Lab Results  Component Value Date   PSA Normal, 0.5 02/21/2014     ECG:  04/19/2019  Vaccines:   Pneumonia: up to date  Flu: 09/19/2019 educated and discussed with patient.  Advanced Care Planning: A voluntary discussion about advance care  planning including the explanation and discussion of advance directives.  Discussed health care proxy and Living will, and the patient was able to identify a health care proxy as wife   Patient Active Problem List   Diagnosis Date Noted   Diabetes mellitus type 2 in obese (HCC) 05/26/2019   Medial meniscus tear 11/08/2017   Lateral meniscus tear 01/30/2016   Bilateral knee pain 09/27/2014   Dyslipidemia 09/24/2014   Hemorrhoids, internal 09/24/2014   Dysmetabolic syndrome 09/24/2014   Obesity (BMI 30.0-34.9) 09/24/2014   Osteoarthritis of left knee 09/24/2014   Abnormal blood sugar 11/25/2008    Past Surgical History:  Procedure Laterality Date   ACHILLES TENDON SURGERY Left    Past Rupture    Family History  Problem Relation Age of Onset   Obesity Mother    Schizophrenia Mother    Atrial fibrillation Mother    Diabetes Mother    Alcohol abuse Father    Cirrhosis Father    Cirrhosis Sister    Alcohol abuse Brother    Allergic rhinitis Daughter    Allergic rhinitis Son    Cancer Maternal Grandmother        Colon    Social History   Socioeconomic History   Marital status: Married    Spouse name: [name]   Number of children: 2   Years of education: Not on file   Highest education level: Master's degree (e.g., MA, MS, MEng, MEd, MSW, MBA)  Occupational History   Occupation: Associate Professor: [employer]  Tobacco Use   Smoking status: Never   Smokeless tobacco: Never  Vaping Use   Vaping Use: Never used  Substance and Sexual Activity   Alcohol use: No    Alcohol/week: 0.0 standard drinks   Drug use: No   Sexual activity: Yes    Partners: Female  Other Topics Concern   Not on file  Social History Narrative   Married   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   Two children, still at home ( teenagers )    Social Determinants of Corporate investment banker Strain: Low Risk  Difficulty of Paying Living Expenses: Not hard at all  Food  Insecurity: No Food Insecurity   Worried About Running Out of Food in the Last Year: Never true   Ran Out of Food in the Last Year: Never true  Transportation Needs: No Transportation Needs   Lack of Transportation (Medical): No   Lack of Transportation (Non-Medical): No  Physical Activity: Sufficiently Active   Days of Exercise per Week: 6 days   Minutes of Exercise per Session: 30 min  Stress: Stress Concern Present   Feeling of Stress : To some extent  Social Connections: Moderately Integrated   Frequency of Communication with Friends and Family: Once a week   Frequency of Social Gatherings with Friends and Family: Once a week   Attends Religious Services: More than 4 times per year   Active Member of [employer] or Organizations: No   Attends Engineer, structural: 1 to 4 times per year   Marital Status: Married  Catering manager Violence: Not At Risk   Fear of Current or Ex-Partner: No   Emotionally Abused: No   Physically Abused: No   Sexually Abused: No     Current Outpatient Medications:    rosuvastatin (CRESTOR) 5 MG tablet, Take 1 tablet (5 mg total) by mouth daily. With coQ10 to prevent cramping, Disp: 90 tablet, Rfl: 1  No Known Allergies   ROS  Constitutional: Negative for fever or weight change.  Respiratory: Negative for cough and shortness of breath.   Cardiovascular: Negative for chest pain or palpitations.  Gastrointestinal: Negative for abdominal pain, no bowel changes.  Musculoskeletal: Negative for gait problem or joint swelling.  Skin: Negative for rash.  Neurological: Negative for dizziness or headache.  No other specific complaints in a complete review of systems (except as listed in HPI above).    Objective  Vitals:   07/02/20 0801  BP: 112/70  Pulse: 83  Resp: 16  Temp: 98 F (36.7 C)  TempSrc: Oral  SpO2: 98%  Weight: 238 lb (108 kg)  Height: 6\' 2"  (1.88 m)    Body mass index is 30.56 kg/m.  Physical Exam  Constitutional:  Patient appears well-developed and well-nourished. No distress.  HENT: Head: Normocephalic and atraumatic. Ears: B TMs ok, no erythema or effusion; Nose: Nose normal. Mouth/Throat: Oropharynx is clear and moist. No oropharyngeal exudate.  Eyes: Conjunctivae and EOM are normal. Pupils are equal, round, and reactive to light. No scleral icterus.  Neck: Normal range of motion. Neck supple. No JVD present. No thyromegaly present.  Cardiovascular: Normal rate, regular rhythm and normal heart sounds.  No murmur heard. No BLE edema. Pulmonary/Chest: Effort normal and breath sounds normal. No respiratory distress. Abdominal: Soft. Bowel sounds are normal, no distension. There is no tenderness. no masses MALE GENITALIA: Normal descended testes bilaterally, no masses palpated, no hernias, no lesions, no discharge RECTAL: not done  Musculoskeletal: Normal range of motion, no joint effusions. No gross deformities Neurological: he is alert and oriented to person, place, and time. No cranial nerve deficit. Coordination, balance, strength, speech and gait are normal. Phalen's and Tinnel's positive  Skin: Skin is warm and dry. No rash noted. No erythema.  Psychiatric: Patient has a normal mood and affect. behavior is normal. Judgment and thought content normal.    Fall Risk: Fall Risk  07/02/2020 03/19/2020 09/19/2019 08/20/2019 08/13/2019  Falls in the past year? 1 0 0 0 0  Number falls in past yr: 0 0 0 - -  Comment - - - - -  Injury with Fall? 0 0 0 - -  Follow up - - - - -    Functional Status Survey: Is the patient deaf or have difficulty hearing?: No Does the patient have difficulty seeing, even when wearing glasses/contacts?: No Does the patient have difficulty concentrating, remembering, or making decisions?: No Does the patient have difficulty walking or climbing stairs?: Yes Does the patient have difficulty dressing or bathing?: No Does the patient have difficulty doing errands alone such as visiting  a doctor's office or shopping?: No    Assessment & Plan  1. Well adult exam   2. Need for Tdap vaccination  - Tdap vaccine greater than or equal to 7yo IM  3. Long-term use of high-risk medication  - CBC with Differential/Platelet - COMPLETE METABOLIC PANEL WITH GFR  4. Diabetes mellitus type 2 in obese (HCC)  - Urine Microalbumin w/creat. ratio - CBC with Differential/Platelet - COMPLETE METABOLIC PANEL WITH GFR - Lipid panel - Hemoglobin A1c  5. Carpal tunnel syndrome of right wrist  Advised a otc brace and avoid activities that aggravates it , discuss it again during next visit     -Prostate cancer screening and PSA options (with potential risks and benefits of testing vs not testing) were discussed along with recent recs/guidelines. -USPSTF grade A and B recommendations reviewed with patient; age-appropriate recommendations, preventive care, screening tests, etc discussed and encouraged; healthy living encouraged; see AVS for patient education given to patient -Discussed importance of 150 minutes of physical activity weekly, eat two servings of fish weekly, eat one serving of tree nuts ( cashews, pistachios, pecans, almonds.Marland Kitchen.) every other day, eat 6 servings of fruit/vegetables daily and drink plenty of water and avoid sweet beverages.

## 2020-07-02 ENCOUNTER — Other Ambulatory Visit: Payer: Self-pay

## 2020-07-02 ENCOUNTER — Encounter: Payer: Self-pay | Admitting: Family Medicine

## 2020-07-02 ENCOUNTER — Ambulatory Visit (INDEPENDENT_AMBULATORY_CARE_PROVIDER_SITE_OTHER): Payer: BC Managed Care – PPO | Admitting: Family Medicine

## 2020-07-02 VITALS — BP 112/70 | HR 83 | Temp 98.0°F | Resp 16 | Ht 74.0 in | Wt 238.0 lb

## 2020-07-02 DIAGNOSIS — Z79899 Other long term (current) drug therapy: Secondary | ICD-10-CM

## 2020-07-02 DIAGNOSIS — G5601 Carpal tunnel syndrome, right upper limb: Secondary | ICD-10-CM

## 2020-07-02 DIAGNOSIS — Z0000 Encounter for general adult medical examination without abnormal findings: Secondary | ICD-10-CM | POA: Diagnosis not present

## 2020-07-02 DIAGNOSIS — E669 Obesity, unspecified: Secondary | ICD-10-CM

## 2020-07-02 DIAGNOSIS — E1169 Type 2 diabetes mellitus with other specified complication: Secondary | ICD-10-CM | POA: Diagnosis not present

## 2020-07-02 DIAGNOSIS — Z23 Encounter for immunization: Secondary | ICD-10-CM

## 2020-07-03 LAB — CBC WITH DIFFERENTIAL/PLATELET
Absolute Monocytes: 288 cells/uL (ref 200–950)
Basophils Absolute: 22 cells/uL (ref 0–200)
Basophils Relative: 0.5 %
Eosinophils Absolute: 90 cells/uL (ref 15–500)
Eosinophils Relative: 2.1 %
HCT: 42.7 % (ref 38.5–50.0)
Hemoglobin: 14.3 g/dL (ref 13.2–17.1)
Lymphs Abs: 1398 cells/uL (ref 850–3900)
MCH: 28.9 pg (ref 27.0–33.0)
MCHC: 33.5 g/dL (ref 32.0–36.0)
MCV: 86.3 fL (ref 80.0–100.0)
MPV: 10.1 fL (ref 7.5–12.5)
Monocytes Relative: 6.7 %
Neutro Abs: 2503 cells/uL (ref 1500–7800)
Neutrophils Relative %: 58.2 %
Platelets: 173 10*3/uL (ref 140–400)
RBC: 4.95 10*6/uL (ref 4.20–5.80)
RDW: 12.6 % (ref 11.0–15.0)
Total Lymphocyte: 32.5 %
WBC: 4.3 10*3/uL (ref 3.8–10.8)

## 2020-07-03 LAB — COMPLETE METABOLIC PANEL WITH GFR
AG Ratio: 1.8 (calc) (ref 1.0–2.5)
ALT: 16 U/L (ref 9–46)
AST: 16 U/L (ref 10–40)
Albumin: 4.6 g/dL (ref 3.6–5.1)
Alkaline phosphatase (APISO): 63 U/L (ref 36–130)
BUN: 21 mg/dL (ref 7–25)
CO2: 25 mmol/L (ref 20–32)
Calcium: 9.6 mg/dL (ref 8.6–10.3)
Chloride: 108 mmol/L (ref 98–110)
Creat: 1.28 mg/dL (ref 0.60–1.35)
GFR, Est African American: 77 mL/min/{1.73_m2} (ref >=60)
GFR, Est Non African American: 66 mL/min/{1.73_m2} (ref >=60)
Globulin: 2.5 g/dL (calc) (ref 1.9–3.7)
Glucose, Bld: 92 mg/dL (ref 65–99)
Potassium: 4.2 mmol/L (ref 3.5–5.3)
Sodium: 141 mmol/L (ref 135–146)
Total Bilirubin: 0.5 mg/dL (ref 0.2–1.2)
Total Protein: 7.1 g/dL (ref 6.1–8.1)

## 2020-07-03 LAB — LIPID PANEL
Cholesterol: 106 mg/dL (ref <200)
HDL: 39 mg/dL — ABNORMAL LOW (ref >=40)
LDL Cholesterol (Calc): 53 mg/dL (calc)
Non-HDL Cholesterol (Calc): 67 mg/dL (calc) (ref <130)
Total CHOL/HDL Ratio: 2.7 (calc) (ref <5.0)
Triglycerides: 56 mg/dL (ref <150)

## 2020-07-03 LAB — MICROALBUMIN / CREATININE URINE RATIO
Creatinine, Urine: 143 mg/dL (ref 20–320)
Microalb, Ur: <0.2 mg/dL

## 2020-07-03 LAB — HEMOGLOBIN A1C
Hgb A1c MFr Bld: 5.6 % of total Hgb (ref <5.7)
Mean Plasma Glucose: 114 mg/dL
eAG (mmol/L): 6.3 mmol/L

## 2020-07-04 NOTE — Addendum Note (Signed)
Addended by: Alba Cory F on: 07/04/2020 03:37 PM   Modules accepted: Level of Service

## 2020-09-18 NOTE — Progress Notes (Deleted)
Name: Jeffrey Schneider   MRN: [id number]    DOB: 05-20-1970   Date:09/18/2020       Progress Note  Subjective  Chief Complaint  Follow Up  HPI  Diabetes type II : he has a long history of pre-diabetes, on 05/24/2019 his A1C went up to 8.2 %, fasting glucose was elevated at 159. He had obesity and hyperlipidemia associated DM. He is doing great since diagnosis, he had lost 47 lbs but he has gained a little bit back, today weight is up to 234 lbs, but he is still eating a carbohydrate restrictive diet . A1C last visit was 5.7 % and today is normal at 5.4 %. He states fasting glucose 80-114. He is taking Rosuvastatin and last was at goal    Dyslipidemia: he is on medication now, he is eating healthier , avoiding fast food , last LDL was at goal    Dysthymia: he worries about his mother, she is 46 yo has schizophrenia , she is back at home, and she seems to be doing better, he is feeling well. phq 9 is back to normal   Bradycardia: he states his Fitbit shows resting heart rate around 58, he denies associated dizziness, chest pain or palpitation. He has been very physically active, he is exercising frequently, going to the gym twice a day, 30-40 minutes at lunch and also at the end of the day , plus plays basketball with friends on Saturdays  GERD: symptoms resolved with weight loss.   Patient Active Problem List   Diagnosis Date Noted   Diabetes mellitus type 2 in obese (HCC) 05/26/2019   Medial meniscus tear 11/08/2017   Lateral meniscus tear 01/30/2016   Bilateral knee pain 09/27/2014   Dyslipidemia 09/24/2014   Hemorrhoids, internal 09/24/2014   Dysmetabolic syndrome 09/24/2014   Obesity (BMI 30.0-34.9) 09/24/2014   Osteoarthritis of left knee 09/24/2014   Abnormal blood sugar 11/25/2008    Past Surgical History:  Procedure Laterality Date   ACHILLES TENDON SURGERY Left    Past Rupture    Family History  Problem Relation Age of Onset   Obesity Mother    Schizophrenia Mother     Atrial fibrillation Mother    Diabetes Mother    Alcohol abuse Father    Cirrhosis Father    Cirrhosis Sister    Alcohol abuse Brother    Allergic rhinitis Daughter    Allergic rhinitis Son    Cancer Maternal Grandmother        Colon    Social History   Tobacco Use   Smoking status: Never   Smokeless tobacco: Never  Substance Use Topics   Alcohol use: No    Alcohol/week: 0.0 standard drinks     Current Outpatient Medications:    rosuvastatin (CRESTOR) 5 MG tablet, Take 1 tablet (5 mg total) by mouth daily. With coQ10 to prevent cramping, Disp: 90 tablet, Rfl: 1  No Known Allergies  I personally reviewed {Reviewed:14835} with the patient/caregiver today.   ROS  ***  Objective  There were no vitals filed for this visit.  There is no height or weight on file to calculate BMI.  Physical Exam ***  Recent Results (from the past 2160 hour(s))  Urine Microalbumin w/creat. ratio     Status: None   Collection Time: 07/02/20  8:37 AM  Result Value Ref Range   Creatinine, Urine 143 20 - 320 mg/dL   Microalb, Ur <3.7 mg/dL    Comment: Reference Range Not established  Microalb Creat Ratio NOTE <30 mcg/mg creat    Comment: NOTE: The urine albumin value is less than  0.2 mg/dL therefore we are unable to calculate  excretion and/or creatinine ratio. . The ADA defines abnormalities in albumin excretion as follows: Marland Kitchen Albuminuria Category        Result (mcg/mg creatinine) . Normal to Mildly increased   <30 Moderately increased         30-299  Severely increased           > OR = 300 . The ADA recommends that at least two of three specimens collected within a 3-6 month period be abnormal before considering a patient to be within a diagnostic category.   CBC with Differential/Platelet     Status: None   Collection Time: 07/02/20  8:37 AM  Result Value Ref Range   WBC 4.3 3.8 - 10.8 Thousand/uL   RBC 4.95 4.20 - 5.80 Million/uL   Hemoglobin 14.3 13.2 - 17.1  g/dL   HCT 01.6 01.0 - 93.2 %   MCV 86.3 80.0 - 100.0 fL   MCH 28.9 27.0 - 33.0 pg   MCHC 33.5 32.0 - 36.0 g/dL   RDW 35.5 73.2 - 20.2 %   Platelets 173 140 - 400 Thousand/uL   MPV 10.1 7.5 - 12.5 fL   Neutro Abs 2,503 1,500 - 7,800 cells/uL   Lymphs Abs 1,398 850 - 3,900 cells/uL   Absolute Monocytes 288 200 - 950 cells/uL   Eosinophils Absolute 90 15 - 500 cells/uL   Basophils Absolute 22 0 - 200 cells/uL   Neutrophils Relative % 58.2 %   Total Lymphocyte 32.5 %   Monocytes Relative 6.7 %   Eosinophils Relative 2.1 %   Basophils Relative 0.5 %  COMPLETE METABOLIC PANEL WITH GFR     Status: None   Collection Time: 07/02/20  8:37 AM  Result Value Ref Range   Glucose, Bld 92 65 - 99 mg/dL    Comment: .            Fasting reference interval .    BUN 21 7 - 25 mg/dL   Creat 5.42 7.06 - 2.37 mg/dL   GFR, Est Non African American 66 > OR = 60 mL/min/1.75m2   GFR, Est African American 77 > OR = 60 mL/min/1.77m2   BUN/Creatinine Ratio NOT APPLICABLE 6 - 22 (calc)   Sodium 141 135 - 146 mmol/L   Potassium 4.2 3.5 - 5.3 mmol/L   Chloride 108 98 - 110 mmol/L   CO2 25 20 - 32 mmol/L   Calcium 9.6 8.6 - 10.3 mg/dL   Total Protein 7.1 6.1 - 8.1 g/dL   Albumin 4.6 3.6 - 5.1 g/dL   Globulin 2.5 1.9 - 3.7 g/dL (calc)   AG Ratio 1.8 1.0 - 2.5 (calc)   Total Bilirubin 0.5 0.2 - 1.2 mg/dL   Alkaline phosphatase (APISO) 63 36 - 130 U/L   AST 16 10 - 40 U/L   ALT 16 9 - 46 U/L  Lipid panel     Status: Abnormal   Collection Time: 07/02/20  8:37 AM  Result Value Ref Range   Cholesterol 106 <200 mg/dL   HDL 39 (L) > OR = 40 mg/dL   Triglycerides 56 <628 mg/dL   LDL Cholesterol (Calc) 53 mg/dL (calc)    Comment: Reference range: <100 . Desirable range <100 mg/dL for primary prevention;   <70 mg/dL for patients with CHD or diabetic patients  with > or =  2 CHD risk factors. Marland Kitchen LDL-C is now calculated using the Martin-Hopkins  calculation, which is a validated novel method providing   better accuracy than the Friedewald equation in the  estimation of LDL-C.  Horald Pollen et al. Lenox Ahr. 1856;314(97): 2061-2068  (http://education.[facility].com/faq/FAQ164)    Total CHOL/HDL Ratio 2.7 <5.0 (calc)   Non-HDL Cholesterol (Calc) 67 <026 mg/dL (calc)    Comment: For patients with diabetes plus 1 major ASCVD risk  factor, treating to a non-HDL-C goal of <100 mg/dL  (LDL-C of <37 mg/dL) is considered a therapeutic  option.   Hemoglobin A1c     Status: None   Collection Time: 07/02/20  8:37 AM  Result Value Ref Range   Hgb A1c MFr Bld 5.6 <5.7 % of total Hgb    Comment: For the purpose of screening for the presence of diabetes: . <5.7%       Consistent with the absence of diabetes 5.7-6.4%    Consistent with increased risk for diabetes             (prediabetes) > or =6.5%  Consistent with diabetes . This assay result is consistent with a decreased risk of diabetes. . Currently, no consensus exists regarding use of hemoglobin A1c for diagnosis of diabetes in children. . According to American Diabetes Association (ADA) guidelines, hemoglobin A1c <7.0% represents optimal control in non-pregnant diabetic patients. Different metrics may apply to specific patient populations.  Standards of Medical Care in Diabetes(ADA). .    Mean Plasma Glucose 114 mg/dL   eAG (mmol/L) 6.3 mmol/L    Diabetic Foot Exam: Diabetic Foot Exam - Simple   No data filed    ***  PHQ2/9: Depression screen [facility] 2/9 07/02/2020 03/19/2020 09/19/2019 07/10/2019 06/19/2019  Decreased Interest 1 0 0 0 2  Down, Depressed, Hopeless 1 0 1 0 1  PHQ - 2 Score 2 0 1 0 3  Altered sleeping 3 - - - 0  Tired, decreased energy 1 - - - 1  Change in appetite 0 - - - 0  Feeling bad or failure about yourself  0 - - - 0  Trouble concentrating 0 - - - 0  Moving slowly or fidgety/restless 0 - - - 0  Suicidal thoughts 0 - - - 0  PHQ-9 Score 6 - - - 4  Difficult doing work/chores - - - - Not difficult at all   Some recent data might be hidden    phq 9 is {gen pos CHY:[id number]} ***  Fall Risk: Fall Risk  07/02/2020 03/19/2020 09/19/2019 08/20/2019 08/13/2019  Falls in the past year? 1 0 0 0 0  Number falls in past yr: 0 0 0 - -  Comment - - - - -  Injury with Fall? 0 0 0 - -  Follow up - - - - -   ***   Functional Status Survey:   ***   Assessment & Plan  *** There are no diagnoses linked to this encounter.

## 2020-09-19 ENCOUNTER — Ambulatory Visit: Payer: BC Managed Care – PPO | Admitting: Family Medicine

## 2020-10-08 ENCOUNTER — Other Ambulatory Visit: Payer: Self-pay

## 2020-10-08 ENCOUNTER — Ambulatory Visit: Payer: BC Managed Care – PPO

## 2020-10-08 DIAGNOSIS — Z23 Encounter for immunization: Secondary | ICD-10-CM

## 2020-10-14 ENCOUNTER — Ambulatory Visit: Payer: BC Managed Care – PPO | Admitting: Family Medicine

## 2020-10-14 ENCOUNTER — Other Ambulatory Visit: Payer: Self-pay

## 2020-10-14 ENCOUNTER — Encounter: Payer: Self-pay | Admitting: Family Medicine

## 2020-10-14 VITALS — BP 126/80 | HR 75 | Temp 98.0°F | Resp 16 | Ht 74.0 in | Wt 239.0 lb

## 2020-10-14 DIAGNOSIS — R001 Bradycardia, unspecified: Secondary | ICD-10-CM

## 2020-10-14 DIAGNOSIS — F341 Dysthymic disorder: Secondary | ICD-10-CM

## 2020-10-14 DIAGNOSIS — K219 Gastro-esophageal reflux disease without esophagitis: Secondary | ICD-10-CM

## 2020-10-14 DIAGNOSIS — E785 Hyperlipidemia, unspecified: Secondary | ICD-10-CM

## 2020-10-14 DIAGNOSIS — E1169 Type 2 diabetes mellitus with other specified complication: Secondary | ICD-10-CM

## 2020-10-14 DIAGNOSIS — E669 Obesity, unspecified: Secondary | ICD-10-CM

## 2020-10-14 DIAGNOSIS — M170 Bilateral primary osteoarthritis of knee: Secondary | ICD-10-CM

## 2020-10-14 LAB — POCT GLYCOSYLATED HEMOGLOBIN (HGB A1C): Hemoglobin A1C: 5.6 % (ref 4.0–5.6)

## 2020-10-14 MED ORDER — DICLOFENAC SODIUM 1 % EX GEL: 2.0000 g | Freq: Four times a day (QID) | CUTANEOUS | 2 refills | Status: DC

## 2020-10-14 MED ORDER — ROSUVASTATIN CALCIUM 5 MG PO TABS: 5.0000 mg | ORAL_TABLET | Freq: Every day | ORAL | 1 refills | Status: DC

## 2020-10-14 NOTE — Progress Notes (Signed)
Name: Jeffrey Schneider   MRN: [id number]    DOB: Jun 18, 1970   Date:10/14/2020       Progress Note  Subjective  Chief Complaint  Follow Up  HPI  Diabetes type II : he has a long history of pre-diabetes, on 05/24/2019 his A1C went up to 8.2 %, fasting glucose was elevated at 159. He had obesity and hyperlipidemia associated DM. He is doing great since diagnosis, he had lost 47 lbs but he has gained a little bit back, weight has been stable over the past 6 months, but he is still eating a carbohydrate restrictive diet . A1C was 5.7 % , 5.4 % and past couple of times 5.6 % . He states fasting glucose average is 105  low of 92 highest of 121  He is taking Rosuvastatin and last was at goal    Dyslipidemia: he is on medication now, he is eating healthier , avoiding fast food , last LDL was at goal at 53, triglycerides was elevated    Dysthymia: he worries about his mother, she is 34 yo has schizophrenia , she is back at home, and she seems to be doing better. He is doing well   Bradycardia: he states his Fitbit shows resting heart rate was around 58 but is doing better in the mid 60's  he denies associated dizziness, chest pain or palpitation. He has been very physically active, he is exercising frequently, going to the gym twice a day, 30-40 minutes at lunch and also at the end of the day , plus plays basketball with friends on Saturdays. Unchanged   GERD: symptoms resolved with weight loss. Unchanged   Knee pain/hamstring tightness: discussed stretching, we will try topical medication   Patient Active Problem List   Diagnosis Date Noted   Diabetes mellitus type 2 in obese (HCC) 05/26/2019   Medial meniscus tear 11/08/2017   Lateral meniscus tear 01/30/2016   Bilateral knee pain 09/27/2014   Dyslipidemia 09/24/2014   Hemorrhoids, internal 09/24/2014   Dysmetabolic syndrome 09/24/2014   Obesity (BMI 30.0-34.9) 09/24/2014   Osteoarthritis of left knee 09/24/2014   Abnormal blood sugar  11/25/2008    Past Surgical History:  Procedure Laterality Date   ACHILLES TENDON SURGERY Left    Past Rupture    Family History  Problem Relation Age of Onset   Obesity Mother    Schizophrenia Mother    Atrial fibrillation Mother    Diabetes Mother    Alcohol abuse Father    Cirrhosis Father    Cirrhosis Sister    Alcohol abuse Brother    Allergic rhinitis Daughter    Allergic rhinitis Son    Cancer Maternal Grandmother        Colon    Social History   Tobacco Use   Smoking status: Never   Smokeless tobacco: Never  Substance Use Topics   Alcohol use: No    Alcohol/week: 0.0 standard drinks     Current Outpatient Medications:    rosuvastatin (CRESTOR) 5 MG tablet, Take 1 tablet (5 mg total) by mouth daily. With coQ10 to prevent cramping, Disp: 90 tablet, Rfl: 1  No Known Allergies  I personally reviewed active problem list, medication list, allergies, family history, social history, health maintenance with the patient/caregiver today.   ROS  Constitutional: Negative for fever or weight change.  Respiratory: Negative for cough and shortness of breath.   Cardiovascular: Negative for chest pain or palpitations.  Gastrointestinal: Negative for abdominal pain, no bowel changes.  Musculoskeletal: Negative for gait problem or joint swelling.  Skin: Negative for rash.  Neurological: Negative for dizziness or headache.  No other specific complaints in a complete review of systems (except as listed in HPI above).   Objective  Vitals:   10/14/20 1122  BP: 126/80  Pulse: 75  Resp: 16  Temp: 98 F (36.7 C)  SpO2: 99%  Weight: 239 lb (108.4 kg)  Height: 6\' 2"  (1.88 m)    Body mass index is 30.69 kg/m.  Physical Exam  Constitutional: Patient appears well-developed and well-nourished. Obese  No distress.  HEENT: head atraumatic, normocephalic, pupils equal and reactive to light, neck supple Cardiovascular: Normal rate, regular rhythm and normal heart  sounds.  No murmur heard. No BLE edema. Pulmonary/Chest: Effort normal and breath sounds normal. No respiratory distress. Abdominal: Soft.  There is no tenderness. Muscular skeletal: crepitus with extension of both knees  Psychiatric: Patient has a normal mood and affect. behavior is normal. Judgment and thought content normal.   Recent Results (from the past 2160 hour(s))  POCT HgB A1C     Status: None   Collection Time: 10/14/20 11:26 AM  Result Value Ref Range   Hemoglobin A1C 5.6 4.0 - 5.6 %   HbA1c POC (<> result, manual entry)     HbA1c, POC (prediabetic range)     HbA1c, POC (controlled diabetic range)      Diabetic Foot Exam: Diabetic Foot Exam - Simple   Simple Foot Form Diabetic Foot exam was performed with the following findings: Yes 10/14/2020 11:40 AM  Visual Inspection See comments: Yes Sensation Testing Intact to touch and monofilament testing bilaterally: Yes Pulse Check Posterior Tibialis and Dorsalis pulse intact bilaterally: Yes Comments Thick toenails, flat feet       PHQ2/9: Depression screen [facility] 2/9 10/14/2020 07/02/2020 03/19/2020 09/19/2019 07/10/2019  Decreased Interest 0 1 0 0 0  Down, Depressed, Hopeless 0 1 0 1 0  PHQ - 2 Score 0 2 0 1 0  Altered sleeping 3 3 - - -  Tired, decreased energy 0 1 - - -  Change in appetite 0 0 - - -  Feeling bad or failure about yourself  0 0 - - -  Trouble concentrating 0 0 - - -  Moving slowly or fidgety/restless 0 0 - - -  Suicidal thoughts 0 0 - - -  PHQ-9 Score 3 6 - - -  Difficult doing work/chores - - - - -  Some recent data might be hidden    phq 9 is negative   Fall Risk: Fall Risk  10/14/2020 07/02/2020 03/19/2020 09/19/2019 08/20/2019  Falls in the past year? 0 1 0 0 0  Number falls in past yr: 0 0 0 0 -  Comment - - - - -  Injury with Fall? 0 0 0 0 -  Risk for fall due to : No Fall Risks - - - -  Follow up Falls prevention discussed - - - -      Functional Status Survey: Is the patient deaf or have  difficulty hearing?: No Does the patient have difficulty seeing, even when wearing glasses/contacts?: No Does the patient have difficulty concentrating, remembering, or making decisions?: No Does the patient have difficulty walking or climbing stairs?: No Does the patient have difficulty dressing or bathing?: No Does the patient have difficulty doing errands alone such as visiting a doctor's office or shopping?: No    Assessment & Plan  1. Diabetes mellitus type 2 in  obese (HCC)  - POCT HgB A1C - HM Diabetes Foot Exam - rosuvastatin (CRESTOR) 5 MG tablet; Take 1 tablet (5 mg total) by mouth daily. With coQ10 to prevent cramping  Dispense: 90 tablet; Refill: 1  2. GERD without esophagitis  Doing well   3. Obesity (BMI 30.0-34.9)  Doing well with life style modification  4. Dyslipidemia  Last level at goal   5. Bradycardia  Stable  6. Dyslipidemia associated with type 2 diabetes mellitus (HCC) - rosuvastatin (CRESTOR) 5 MG tablet; Take 1 tablet (5 mg total) by mouth daily. With coQ10 to prevent cramping  Dispense: 90 tablet; Refill: 1  7. Dysthymia  Doing well   8. Primary osteoarthritis of both knees  - diclofenac Sodium (VOLTAREN) 1 % GEL; Apply 2 g topically 4 (four) times daily.  Dispense: 100 g; Refill: 2

## 2020-11-13 LAB — HM DIABETES EYE EXAM

## 2020-12-16 DIAGNOSIS — Z23 Encounter for immunization: Secondary | ICD-10-CM | POA: Diagnosis not present

## 2021-01-03 DIAGNOSIS — M7989 Other specified soft tissue disorders: Secondary | ICD-10-CM | POA: Diagnosis not present

## 2021-01-03 DIAGNOSIS — S62655A Nondisplaced fracture of medial phalanx of left ring finger, initial encounter for closed fracture: Secondary | ICD-10-CM | POA: Diagnosis not present

## 2021-01-03 DIAGNOSIS — S63285A Dislocation of proximal interphalangeal joint of left ring finger, initial encounter: Secondary | ICD-10-CM | POA: Diagnosis not present

## 2021-01-03 DIAGNOSIS — S62625A Displaced fracture of medial phalanx of left ring finger, initial encounter for closed fracture: Secondary | ICD-10-CM | POA: Diagnosis not present

## 2021-01-03 DIAGNOSIS — S6992XA Unspecified injury of left wrist, hand and finger(s), initial encounter: Secondary | ICD-10-CM | POA: Diagnosis not present

## 2021-01-09 DIAGNOSIS — M25542 Pain in joints of left hand: Secondary | ICD-10-CM | POA: Diagnosis not present

## 2021-01-09 DIAGNOSIS — S63275A Dislocation of unspecified interphalangeal joint of left ring finger, initial encounter: Secondary | ICD-10-CM | POA: Diagnosis not present

## 2021-01-09 DIAGNOSIS — M25541 Pain in joints of right hand: Secondary | ICD-10-CM | POA: Diagnosis not present

## 2021-04-10 NOTE — Progress Notes (Signed)
Name: Jeffrey Schneider   MRN: [id number]    DOB: [birth year]   Date:04/13/2021 ? ?     Progress Note ? ?Subjective ? ?Chief Complaint ? ?Follow Up ? ?HPI ? ?Diabetes type II : he has a long history of pre-diabetes, on 05/24/2019 his A1C went up to 8.2 %, fasting glucose was elevated at 159. He has obesity and hyperlipidemia associated with DM. He is doing great since diagnosis, he had lost 47 lbs but he is only working out once a day and weight is up again. His home average has been 105 . A1C was 5.7 % , 5.4 % and past couple of times 5.6 % . He states fasting glucose average is 105 , lowest of 90 and highest 129 fasting .  He is taking Rosuvastatin and last LDL was at goal  ?  ?Dyslipidemia: he is on medication now, he is eating healthier, avoiding fast food , last LDL was at goal at 53, triglycerides was elevated He states not exercising twice a day lately and has gained some weight. Still working out at the lunch  ?  ?Dysthymia: he worries about his mother, she is 49 yo has schizophrenia , his sister lives with her  and she seems to be doing better. Exercise has helps with his mood also  ? ?Bradycardia: he states his Fitbit was showing  resting heart rate was around 58 to 60's,he denies associated dizziness, chest pain or palpitation. He had COVID last Fall and his heart rate was higher at the time but is normalizing again.  ? ?GERD: symptoms resolved with weight loss , he gained some weight and has noticed mild increase in symptoms lately, he will try to cut down on caffeine again.  ? ?Insomnia: he states going to bed around 10 pm, waking up around 3 am and having difficulty falling back asleep and feels tired during the day. Discussed mindfulness. He states his mind gets busy , worried about work , financial problems .  ? ? ?Patient Active Problem List  ? Diagnosis Date Noted  ? Diabetes mellitus type 2 in obese (HCC) 05/26/2019  ? Medial meniscus tear 11/08/2017  ? Lateral meniscus tear 01/30/2016  ? Bilateral  knee pain 09/27/2014  ? Dyslipidemia 09/24/2014  ? Hemorrhoids, internal 09/24/2014  ? Dysmetabolic syndrome 09/24/2014  ? Obesity (BMI 30.0-34.9) 09/24/2014  ? Osteoarthritis of left knee 09/24/2014  ? Abnormal blood sugar 11/25/2008  ? ? ?Past Surgical History:  ?Procedure Laterality Date  ? ACHILLES TENDON SURGERY Left   ? Past Rupture  ? ? ?Family History  ?Problem Relation Age of Onset  ? Obesity Mother   ? Schizophrenia Mother   ? Atrial fibrillation Mother   ? Diabetes Mother   ? Alcohol abuse Father   ? Cirrhosis Father   ? Cirrhosis Sister   ? Alcohol abuse Brother   ? Allergic rhinitis Daughter   ? Allergic rhinitis Son   ? Cancer Maternal Grandmother   ?     Colon  ? ? ?Social History  ? ?Tobacco Use  ? Smoking status: Never  ? Smokeless tobacco: Never  ?Substance Use Topics  ? Alcohol use: No  ?  Alcohol/week: 0.0 standard drinks  ? ? ? ?Current Outpatient Medications:  ?  diclofenac Sodium (VOLTAREN) 1 % GEL, Apply 2 g topically 4 (four) times daily., Disp: 100 g, Rfl: 2 ?  rosuvastatin (CRESTOR) 5 MG tablet, Take 1 tablet (5 mg total) by mouth daily. With coQ10 to prevent  cramping, Disp: 90 tablet, Rfl: 1 ? ?No Known Allergies ? ?I personally reviewed active problem list, medication list, allergies, family history, social history, health maintenance with the patient/caregiver today. ? ? ?ROS ? ?Constitutional: Negative for fever, positive for  weight change.  ?Respiratory: Negative for cough and shortness of breath.   ?Cardiovascular: Negative for chest pain or palpitations.  ?Gastrointestinal: Negative for abdominal pain, no bowel changes.  ?Musculoskeletal: Negative for gait problem or joint swelling.  ?Skin: Negative for rash.  ?Neurological: Negative for dizziness or headache.  ?No other specific complaints in a complete review of systems (except as listed in HPI above).  ? ?Objective ? ?Vitals:  ? 04/13/21 0843  ?BP: 126/70  ?Pulse: 82  ?Resp: 16  ?SpO2: 97%  ?Weight: 247 lb (112 kg)  ?Height: 6'  2" (1.88 m)  ? ? ?Body mass index is 31.71 kg/m?. ? ?Physical Exam ? ?Constitutional: Patient appears well-developed and well-nourished. Obese  No distress.  ?HEENT: head atraumatic, normocephalic, pupils equal and reactive to light, neck supple ?Cardiovascular: Normal rate, regular rhythm and normal heart sounds.  No murmur heard. No BLE edema. ?Pulmonary/Chest: Effort normal and breath sounds normal. No respiratory distress. ?Abdominal: Soft.  There is no tenderness. ?Psychiatric: Patient has a normal mood and affect. behavior is normal. Judgment and thought content normal.  ? ?PHQ2/9: ? ?  04/13/2021  ?  8:42 AM 10/14/2020  ? 11:15 AM 07/02/2020  ?  7:54 AM 03/19/2020  ?  8:32 AM 09/19/2019  ?  8:41 AM  ?Depression screen PHQ 2/9  ?Decreased Interest 0 0 1 0 0  ?Down, Depressed, Hopeless 0 0 1 0 1  ?PHQ - 2 Score 0 0 2 0 1  ?Altered sleeping 1 3 3     ?Tired, decreased energy 0 0 1    ?Change in appetite 0 0 0    ?Feeling bad or failure about yourself  0 0 0    ?Trouble concentrating 0 0 0    ?Moving slowly or fidgety/restless 0 0 0    ?Suicidal thoughts 0 0 0    ?PHQ-9 Score 1 3 6     ?  ?phq 9 is negative ? ? ?Fall Risk: ? ?  04/13/2021  ?  8:42 AM 10/14/2020  ? 11:15 AM 07/02/2020  ?  7:54 AM 03/19/2020  ?  8:32 AM 09/19/2019  ?  8:41 AM  ?Fall Risk   ?Falls in the past year? 0 0 1 0 0  ?Number falls in past yr: 0 0 0 0 0  ?Injury with Fall? 0 0 0 0 0  ?Risk for fall due to : No Fall Risks No Fall Risks     ?Follow up Falls prevention discussed Falls prevention discussed     ? ? ? ? ?Functional Status Survey: ?Is the patient deaf or have difficulty hearing?: No ?Does the patient have difficulty seeing, even when wearing glasses/contacts?: Yes ?Does the patient have difficulty concentrating, remembering, or making decisions?: No ?Does the patient have difficulty walking or climbing stairs?: Yes ?Does the patient have difficulty dressing or bathing?: No ?Does the patient have difficulty doing errands alone such as visiting a  doctor's office or shopping?: No ? ? ? ?Assessment & Plan ? ?1. Dyslipidemia associated with type 2 diabetes mellitus (HCC) ? ?- HgB A1c ? ?2. Diabetes mellitus type 2 in obese [facility]) ? ?- HgB A1c ? ?3. GERD without esophagitis ? ? ?4. Dysthymia ? ? ?5. Primary osteoarthritis of both knees ? ? ?  6. Insomnia, unspecified type ? ?- melatonin 3 MG TABS tablet; Take 1 tablet (3 mg total) by mouth at bedtime.  Dispense: 100 tablet; Refill: 0  ?

## 2021-04-13 ENCOUNTER — Ambulatory Visit: Payer: BC Managed Care – PPO | Admitting: Family Medicine

## 2021-04-13 ENCOUNTER — Encounter: Payer: Self-pay | Admitting: Family Medicine

## 2021-04-13 VITALS — BP 126/70 | HR 82 | Resp 16 | Ht 74.0 in | Wt 247.0 lb

## 2021-04-13 DIAGNOSIS — E669 Obesity, unspecified: Secondary | ICD-10-CM

## 2021-04-13 DIAGNOSIS — K219 Gastro-esophageal reflux disease without esophagitis: Secondary | ICD-10-CM | POA: Diagnosis not present

## 2021-04-13 DIAGNOSIS — E785 Hyperlipidemia, unspecified: Secondary | ICD-10-CM

## 2021-04-13 DIAGNOSIS — M170 Bilateral primary osteoarthritis of knee: Secondary | ICD-10-CM

## 2021-04-13 DIAGNOSIS — G4700 Insomnia, unspecified: Secondary | ICD-10-CM

## 2021-04-13 DIAGNOSIS — E1169 Type 2 diabetes mellitus with other specified complication: Secondary | ICD-10-CM

## 2021-04-13 DIAGNOSIS — F341 Dysthymic disorder: Secondary | ICD-10-CM | POA: Diagnosis not present

## 2021-04-13 MED ORDER — MELATONIN 3 MG PO TABS: 3.0000 mg | ORAL_TABLET | Freq: Every day | ORAL | 0 refills | Status: DC

## 2021-04-13 NOTE — Patient Instructions (Signed)
May you be happy, healthy safe  ?

## 2021-04-14 ENCOUNTER — Other Ambulatory Visit: Payer: Self-pay

## 2021-04-14 ENCOUNTER — Other Ambulatory Visit: Payer: BC Managed Care – PPO

## 2021-04-14 DIAGNOSIS — E1169 Type 2 diabetes mellitus with other specified complication: Secondary | ICD-10-CM

## 2021-04-14 DIAGNOSIS — E785 Hyperlipidemia, unspecified: Secondary | ICD-10-CM

## 2021-04-14 DIAGNOSIS — E669 Obesity, unspecified: Secondary | ICD-10-CM

## 2021-04-14 NOTE — Progress Notes (Signed)
Lab work

## 2021-04-15 LAB — HGB A1C W/O EAG: Hgb A1c MFr Bld: 5.9 % — ABNORMAL HIGH (ref 4.8–5.6)

## 2021-04-20 IMAGING — CR DG CHEST 2V
1 series · 2 of 2 positions shown · non-contrast
Comparison: 03/02/2019

CLINICAL DATA: Chest pain

EXAM:
CHEST - 2 VIEW

[Series 1: w chest pa · 0.14mm/px · 2 of 2 slices shown]
[im 1/2]
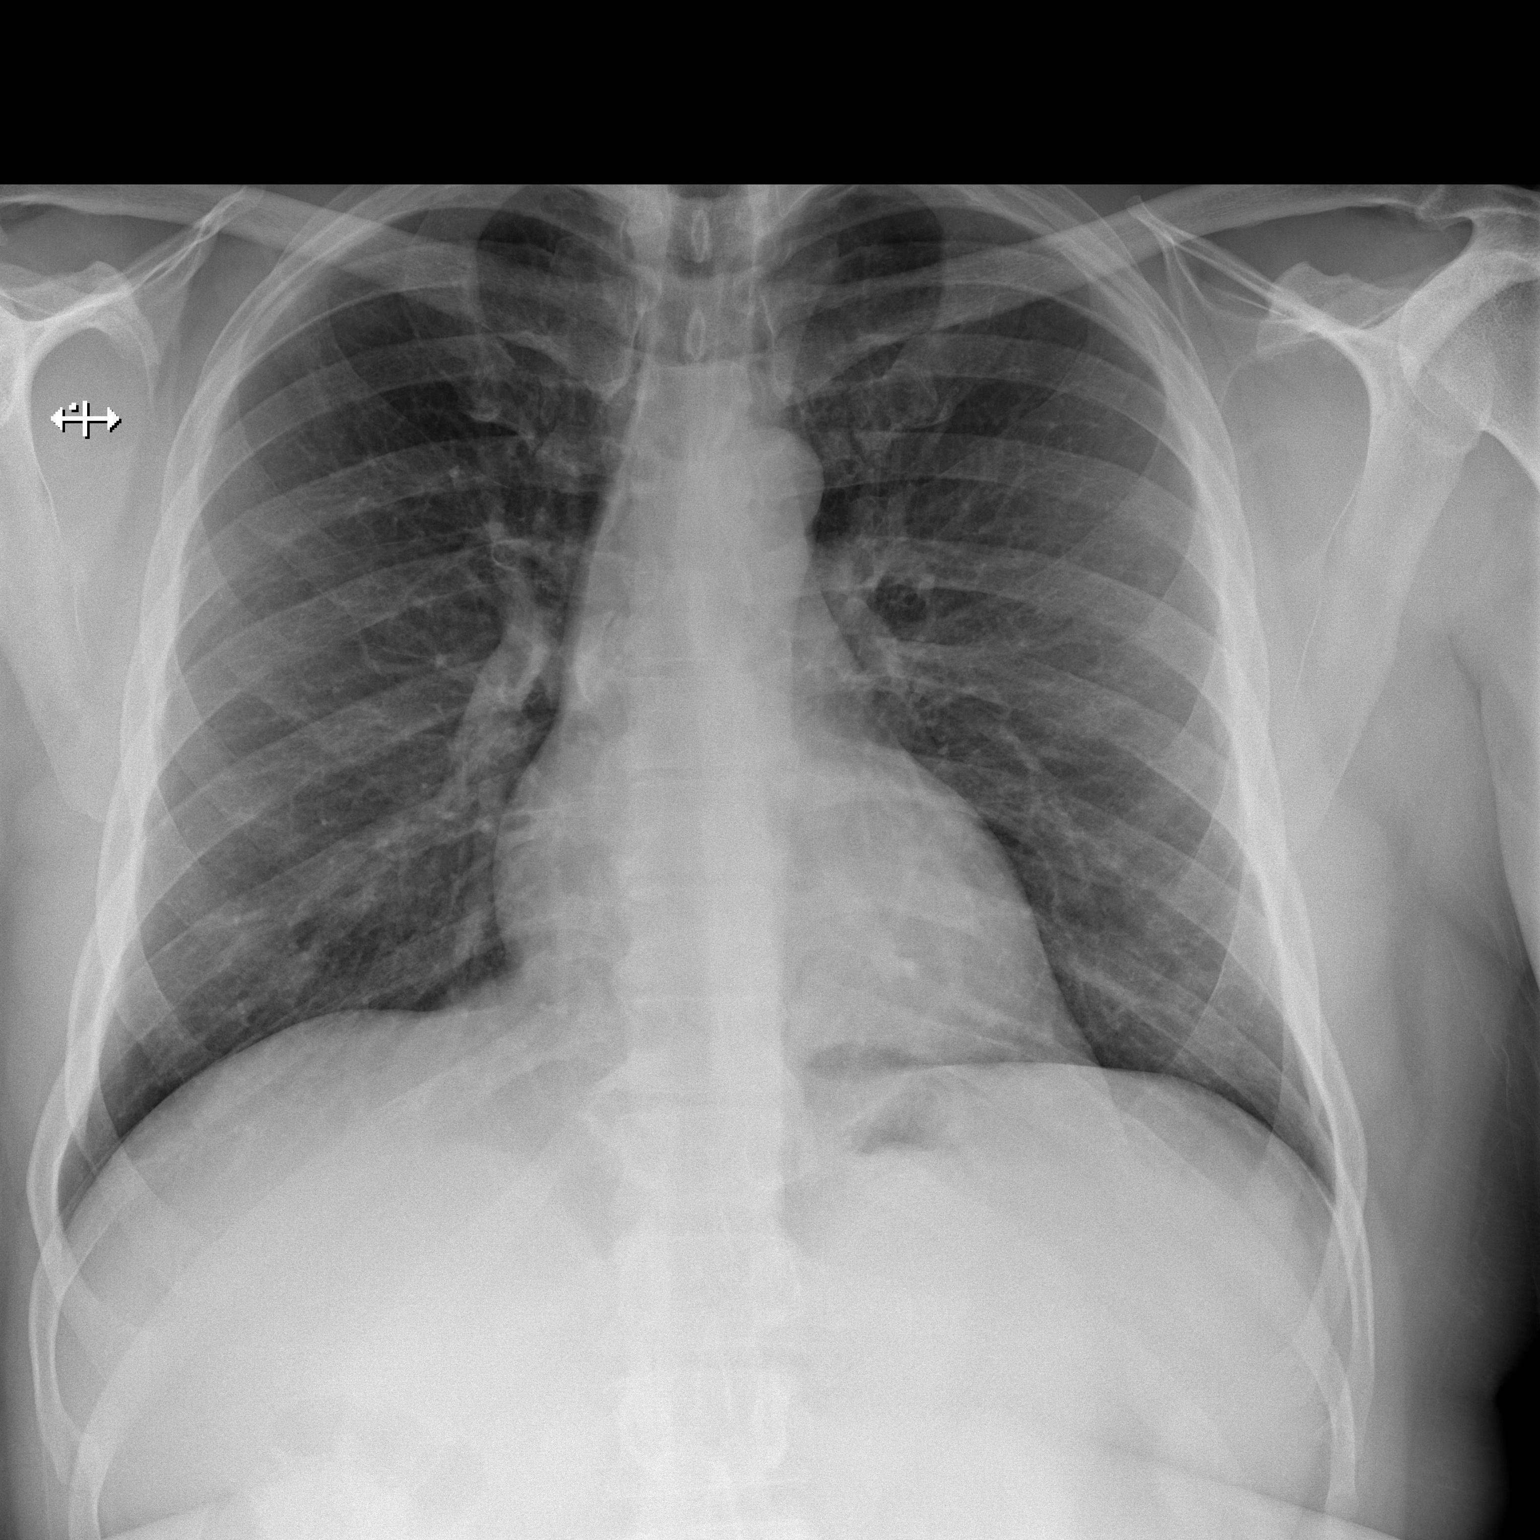
[im 2/2]
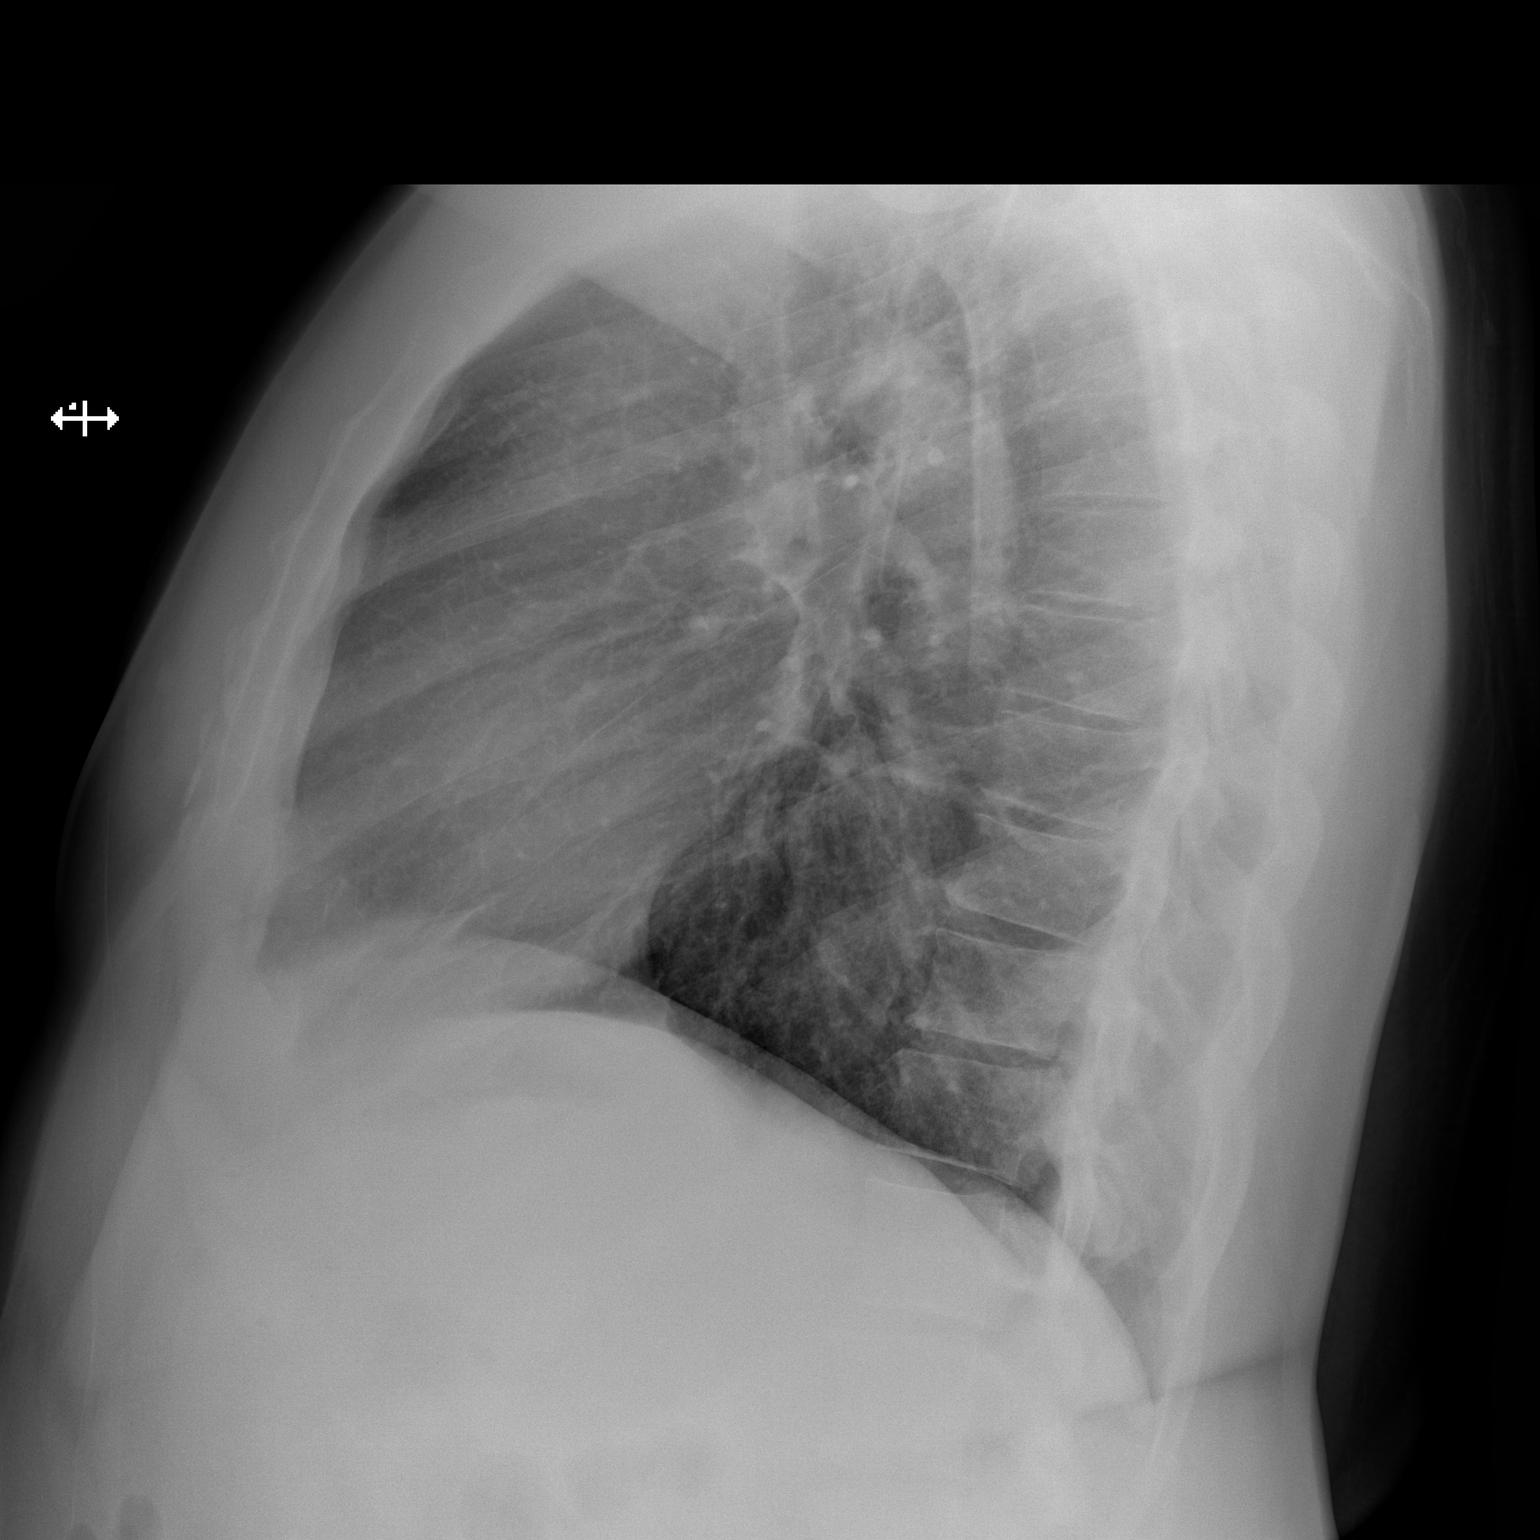

[2 of 2 positions shown; findings below may reference images not displayed]

FINDINGS: The heart size and mediastinal contours are within normal limits.
Previously seen bilateral airspace opacities have resolved. No
pleural effusion or pneumothorax. The visualized skeletal structures
are unremarkable.
IMPRESSION: No acute cardiopulmonary disease.

## 2021-05-18 DIAGNOSIS — E119 Type 2 diabetes mellitus without complications: Secondary | ICD-10-CM | POA: Diagnosis not present

## 2021-06-17 ENCOUNTER — Other Ambulatory Visit: Payer: Self-pay | Admitting: Family Medicine

## 2021-06-17 DIAGNOSIS — E669 Obesity, unspecified: Secondary | ICD-10-CM

## 2021-06-17 DIAGNOSIS — E1169 Type 2 diabetes mellitus with other specified complication: Secondary | ICD-10-CM

## 2021-06-18 DIAGNOSIS — E119 Type 2 diabetes mellitus without complications: Secondary | ICD-10-CM | POA: Diagnosis not present

## 2021-07-17 ENCOUNTER — Encounter: Payer: Self-pay | Admitting: Family Medicine

## 2021-07-17 ENCOUNTER — Ambulatory Visit: Payer: BC Managed Care – PPO | Admitting: Family Medicine

## 2021-07-17 VITALS — BP 128/72 | HR 81 | Resp 16 | Ht 74.0 in | Wt 243.0 lb

## 2021-07-17 DIAGNOSIS — Z79899 Other long term (current) drug therapy: Secondary | ICD-10-CM | POA: Diagnosis not present

## 2021-07-17 DIAGNOSIS — M170 Bilateral primary osteoarthritis of knee: Secondary | ICD-10-CM

## 2021-07-17 DIAGNOSIS — E1169 Type 2 diabetes mellitus with other specified complication: Secondary | ICD-10-CM

## 2021-07-17 DIAGNOSIS — F341 Dysthymic disorder: Secondary | ICD-10-CM | POA: Insufficient documentation

## 2021-07-17 DIAGNOSIS — E785 Hyperlipidemia, unspecified: Secondary | ICD-10-CM | POA: Diagnosis not present

## 2021-07-17 DIAGNOSIS — K219 Gastro-esophageal reflux disease without esophagitis: Secondary | ICD-10-CM | POA: Insufficient documentation

## 2021-07-17 DIAGNOSIS — F5105 Insomnia due to other mental disorder: Secondary | ICD-10-CM | POA: Insufficient documentation

## 2021-07-17 LAB — POCT GLYCOSYLATED HEMOGLOBIN (HGB A1C): Hemoglobin A1C: 5.8 % — AB (ref 4.0–5.6)

## 2021-07-17 NOTE — Progress Notes (Signed)
Name: Jeffrey Schneider   MRN: [id number]    DOB: 25-May-1970   Date:07/17/2021       Progress Note  Subjective  Chief Complaint  Follow Up  HPI  Diabetes type II : he has a long history of pre-diabetes, on 05/24/2019 his A1C went up to 8.2 %, fasting glucose was elevated at 159. He has obesity and hyperlipidemia associated with DM. He is doing great since diagnosis, he has lost weight and has been exercising on a regular basis. Plays basketball twice a week and also more cardio and weight training .  He is taking Rosuvastatin and last LDL was at goal , we will recheck labs today    Dyslipidemia: he is on medication now, he is eating healthier, avoiding fast food , last LDL was at goal at 53, triglycerides was elevated He has resumed regular physical activity since last visit   Knee pain: he has always been active, has crepitus on both knees and legs aches at night, but worse after he plays basketball, he is not interested in seeing ortho. He also has pain going up stairs. Discussed strength training and referral to Ortho. He would like to defer referral for now  Cardiac calcium score: he asked me about it, explained it is for screening and not covered by insurance, he will think about it   Patient Active Problem List   Diagnosis Date Noted   Insomnia due to other mental disorder 07/17/2021   Long-term use of high-risk medication 07/17/2021   GERD without esophagitis 07/17/2021   Dysthymia 07/17/2021   Dyslipidemia associated with type 2 diabetes mellitus (HCC) 05/26/2019   Medial meniscus tear 11/08/2017   Lateral meniscus tear 01/30/2016   Bilateral knee pain 09/27/2014   Dyslipidemia 09/24/2014   Hemorrhoids, internal 09/24/2014   Dysmetabolic syndrome 09/24/2014   Obesity (BMI 30.0-34.9) 09/24/2014   Osteoarthritis of left knee 09/24/2014    Past Surgical History:  Procedure Laterality Date   ACHILLES TENDON SURGERY Left    Past Rupture    Family History  Problem  Relation Age of Onset   Obesity Mother    Schizophrenia Mother    Atrial fibrillation Mother    Diabetes Mother    Alcohol abuse Father    Cirrhosis Father    Cirrhosis Sister    Alcohol abuse Brother    Allergic rhinitis Daughter    Allergic rhinitis Son    Cancer Maternal Grandmother        Colon    Social History   Tobacco Use   Smoking status: Never   Smokeless tobacco: Never  Substance Use Topics   Alcohol use: No    Alcohol/week: 0.0 standard drinks of alcohol     Current Outpatient Medications:    diclofenac Sodium (VOLTAREN) 1 % GEL, Apply 2 g topically 4 (four) times daily., Disp: 100 g, Rfl: 2   melatonin 3 MG TABS tablet, Take 1 tablet (3 mg total) by mouth at bedtime., Disp: 100 tablet, Rfl: 0   rosuvastatin (CRESTOR) 5 MG tablet, TAKE 1 TABLET BY MOUTH DAILY. WITH COQ10 TO PREVENT CRAMPING, Disp: 90 tablet, Rfl: 1  No Known Allergies  I personally reviewed active problem list, medication list, allergies, family history, social history, health maintenance with the patient/caregiver today.   ROS  Ten systems reviewed and is negative except as mentioned in HPI   Objective  Vitals:   07/17/21 0923  BP: 128/72  Pulse: 81  Resp: 16  SpO2: 99%  Weight: 243  lb (110.2 kg)  Height: 6\' 2"  (1.88 m)    Body mass index is 31.2 kg/m.  Physical Exam  Constitutional: Patient appears well-developed and well-nourished. Obese  No distress.  HEENT: head atraumatic, normocephalic, pupils equal and reactive to light,, neck supple Cardiovascular: Normal rate, regular rhythm and normal heart sounds.  No murmur heard. No BLE edema. Pulmonary/Chest: Effort normal and breath sounds normal. No respiratory distress. Abdominal: Soft.  There is no tenderness. Muscular skeletal: normal gait, crepitus with extension of both knees, but no effusion  Psychiatric: Patient has a normal mood and affect. behavior is normal. Judgment and thought content normal.   Recent Results  (from the past 2160 hour(s))  POCT HgB A1C     Status: Abnormal   Collection Time: 07/17/21  9:32 AM  Result Value Ref Range   Hemoglobin A1C 5.8 (A) 4.0 - 5.6 %   HbA1c POC (<> result, manual entry)     HbA1c, POC (prediabetic range)     HbA1c, POC (controlled diabetic range)       PHQ2/9:    07/17/2021    9:30 AM 04/13/2021    8:42 AM 10/14/2020   11:15 AM 07/02/2020    7:54 AM 03/19/2020    8:32 AM  Depression screen PHQ 2/9  Decreased Interest 0 0 0 1 0  Down, Depressed, Hopeless 0 0 0 1 0  PHQ - 2 Score 0 0 0 2 0  Altered sleeping 0 1 3 3    Tired, decreased energy 0 0 0 1   Change in appetite 0 0 0 0   Feeling bad or failure about yourself  0 0 0 0   Trouble concentrating 0 0 0 0   Moving slowly or fidgety/restless 0 0 0 0   Suicidal thoughts 0 0 0 0   PHQ-9 Score 0 1 3 6      phq 9 is negative   Fall Risk:    07/17/2021    9:30 AM 04/13/2021    8:42 AM 10/14/2020   11:15 AM 07/02/2020    7:54 AM 03/19/2020    8:32 AM  Fall Risk   Falls in the past year? 0 0 0 1 0  Number falls in past yr: 0 0 0 0 0  Injury with Fall? 0 0 0 0 0  Risk for fall due to : No Fall Risks No Fall Risks No Fall Risks    Follow up Falls prevention discussed Falls prevention discussed Falls prevention discussed        Functional Status Survey: Is the patient deaf or have difficulty hearing?: No Does the patient have difficulty seeing, even when wearing glasses/contacts?: Yes Does the patient have difficulty concentrating, remembering, or making decisions?: No Does the patient have difficulty walking or climbing stairs?: Yes Does the patient have difficulty dressing or bathing?: No Does the patient have difficulty doing errands alone such as visiting a doctor's office or shopping?: No    Assessment & Plan  1. Dyslipidemia associated with type 2 diabetes mellitus (HCC)  - POCT HgB A1C - Urine Microalbumin w/creat. ratio  2. Primary osteoarthritis of both knees   3.  Dyslipidemia  - Lipid panel  4. Long-term use of high-risk medication  - COMPLETE METABOLIC PANEL WITH GFR - Comprehensive metabolic panel - CBC with Differential/Platelet

## 2021-07-18 DIAGNOSIS — E119 Type 2 diabetes mellitus without complications: Secondary | ICD-10-CM | POA: Diagnosis not present

## 2021-07-31 ENCOUNTER — Telehealth: Payer: Self-pay | Admitting: Family Medicine

## 2021-07-31 DIAGNOSIS — E1169 Type 2 diabetes mellitus with other specified complication: Secondary | ICD-10-CM

## 2021-07-31 DIAGNOSIS — Z79899 Other long term (current) drug therapy: Secondary | ICD-10-CM

## 2021-07-31 NOTE — Addendum Note (Signed)
Addended by: Forde Radon on: 07/31/2021 02:08 PM   Modules accepted: Orders

## 2021-07-31 NOTE — Telephone Encounter (Signed)
Pt needs lab orders for [facility]   Copied from CRM [id number]. Topic: General - Inquiry >> Jul 31, 2021  1:19 PM De Blanch wrote: Reason for CRM: Pt stated Dr.Sowles wrote a paper script at his last appointment 06/30 to have labs done. Elsewhere, pt stated he lost the scrip and is wanting to come by and pick up a new one.    Please advise.

## 2021-07-31 NOTE — Addendum Note (Signed)
Addended by: Forde Radon on: 07/31/2021 02:01 PM   Modules accepted: Orders

## 2021-07-31 NOTE — Telephone Encounter (Signed)
Actually 3 orders were put in for [facility] but it had to be done by [facility].

## 2021-07-31 NOTE — Telephone Encounter (Signed)
Pt has been notified lab orders have been re printed for [facility]. 2 out of the 4 labs ordered were placed for [facility] but he needs it for [facility]. I have reordered the other 2 and now all 4 labs he can get them done by [facility].

## 2021-08-05 ENCOUNTER — Other Ambulatory Visit: Payer: BC Managed Care – PPO

## 2021-08-05 DIAGNOSIS — Z79899 Other long term (current) drug therapy: Secondary | ICD-10-CM

## 2021-08-05 DIAGNOSIS — E785 Hyperlipidemia, unspecified: Secondary | ICD-10-CM

## 2021-08-05 DIAGNOSIS — E1169 Type 2 diabetes mellitus with other specified complication: Secondary | ICD-10-CM

## 2021-08-07 ENCOUNTER — Other Ambulatory Visit: Payer: Self-pay | Admitting: Family Medicine

## 2021-08-07 DIAGNOSIS — G4700 Insomnia, unspecified: Secondary | ICD-10-CM

## 2021-08-07 LAB — CBC WITH DIFFERENTIAL/PLATELET
Basophils Absolute: 0 10*3/uL (ref 0.0–0.2)
Basos: 0 %
EOS (ABSOLUTE): 0.1 10*3/uL (ref 0.0–0.4)
Eos: 2 %
Hematocrit: 41.8 % (ref 37.5–51.0)
Hemoglobin: 14.4 g/dL (ref 13.0–17.7)
Immature Grans (Abs): 0 10*3/uL (ref 0.0–0.1)
Immature Granulocytes: 0 %
Lymphocytes Absolute: 1.6 10*3/uL (ref 0.7–3.1)
Lymphs: 27 %
MCH: 29.6 pg (ref 26.6–33.0)
MCHC: 34.4 g/dL (ref 31.5–35.7)
MCV: 86 fL (ref 79–97)
Monocytes Absolute: 0.3 10*3/uL (ref 0.1–0.9)
Monocytes: 5 %
Neutrophils Absolute: 3.8 10*3/uL (ref 1.4–7.0)
Neutrophils: 66 %
Platelets: 187 10*3/uL (ref 150–450)
RBC: 4.87 x10E6/uL (ref 4.14–5.80)
RDW: 13.1 % (ref 11.6–15.4)
WBC: 5.8 10*3/uL (ref 3.4–10.8)

## 2021-08-07 LAB — COMPREHENSIVE METABOLIC PANEL
ALT: 17 IU/L (ref 0–44)
AST: 17 IU/L (ref 0–40)
Albumin/Globulin Ratio: 1.7 (ref 1.2–2.2)
Albumin: 4.6 g/dL (ref 4.1–5.1)
Alkaline Phosphatase: 81 IU/L (ref 44–121)
BUN/Creatinine Ratio: 19 (ref 9–20)
BUN: 21 mg/dL (ref 6–24)
Bilirubin Total: 0.3 mg/dL (ref 0.0–1.2)
CO2: 21 mmol/L (ref 20–29)
Calcium: 9.6 mg/dL (ref 8.7–10.2)
Chloride: 108 mmol/L — ABNORMAL HIGH (ref 96–106)
Creatinine, Ser: 1.09 mg/dL (ref 0.76–1.27)
Globulin, Total: 2.7 g/dL (ref 1.5–4.5)
Glucose: 108 mg/dL — ABNORMAL HIGH (ref 70–99)
Potassium: 4.3 mmol/L (ref 3.5–5.2)
Sodium: 144 mmol/L (ref 134–144)
Total Protein: 7.3 g/dL (ref 6.0–8.5)
eGFR: 84 mL/min/{1.73_m2} (ref >59)

## 2021-08-07 LAB — LIPID PANEL
Chol/HDL Ratio: 2.8 ratio (ref 0.0–5.0)
Cholesterol, Total: 122 mg/dL (ref 100–199)
HDL: 44 mg/dL (ref >39)
LDL Chol Calc (NIH): 65 mg/dL (ref 0–99)
Triglycerides: 59 mg/dL (ref 0–149)
VLDL Cholesterol Cal: 13 mg/dL (ref 5–40)

## 2021-08-07 LAB — MICROALBUMIN / CREATININE URINE RATIO
Creatinine, Urine: 188.5 mg/dL
Microalb/Creat Ratio: 2 mg/g creat (ref 0–29)
Microalbumin, Urine: 3.9 ug/mL

## 2021-08-10 MED ORDER — MELATONIN 3 MG PO TABS: 3.0000 mg | ORAL_TABLET | Freq: Every day | ORAL | 0 refills | Status: DC

## 2021-08-18 DIAGNOSIS — E119 Type 2 diabetes mellitus without complications: Secondary | ICD-10-CM | POA: Diagnosis not present

## 2021-09-18 DIAGNOSIS — E119 Type 2 diabetes mellitus without complications: Secondary | ICD-10-CM | POA: Diagnosis not present

## 2021-10-18 DIAGNOSIS — E119 Type 2 diabetes mellitus without complications: Secondary | ICD-10-CM | POA: Diagnosis not present

## 2021-11-10 DIAGNOSIS — Z23 Encounter for immunization: Secondary | ICD-10-CM | POA: Diagnosis not present

## 2021-11-18 DIAGNOSIS — E119 Type 2 diabetes mellitus without complications: Secondary | ICD-10-CM | POA: Diagnosis not present

## 2021-11-23 NOTE — Progress Notes (Unsigned)
Name: Jeffrey Schneider   MRN: [id number]    DOB: Nov 03, 1970   Date:11/24/2021       Progress Note  Subjective  Chief Complaint  CPE  HPI  Patient presents for annual CPE.  IPSS Questionnaire (AUA-7): Over the past month.   1)  How often have you had a sensation of not emptying your bladder completely after you finish urinating?  0 - Not at all  2)  How often have you had to urinate again less than two hours after you finished urinating? 0 - Not at all  3)  How often have you found you stopped and started again several times when you urinated?  0 - Not at all  4) How difficult have you found it to postpone urination?  0 - Not at all  5) How often have you had a weak urinary stream?  0 - Not at all  6) How often have you had to push or strain to begin urination?  0 - Not at all  7) How many times did you most typically get up to urinate from the time you went to bed until the time you got up in the morning?  2 - 2 times  Total score:  0-7 mildly symptomatic   8-19 moderately symptomatic   20-35 severely symptomatic     Diet: he is following a diabetic diet  Exercise: he will try to increase physical activity  Last Dental Exam: up to date  Last Eye Exam: due for repeat exam   Depression: phq 9 is positive, he states he feels like he worries about the state of the world, also mid life crisis, not where he expected to be at this time of his life.     11/24/2021    8:55 AM 07/17/2021    9:30 AM 04/13/2021    8:42 AM 10/14/2020   11:15 AM 07/02/2020    7:54 AM  Depression screen PHQ 2/9  Decreased Interest 1 0 0 0 1  Down, Depressed, Hopeless 1 0 0 0 1  PHQ - 2 Score 2 0 0 0 2  Altered sleeping 0 0 1 3 3   Tired, decreased energy 0 0 0 0 1  Change in appetite 0 0 0 0 0  Feeling bad or failure about yourself  0 0 0 0 0  Trouble concentrating 0 0 0 0 0  Moving slowly or fidgety/restless 0 0 0 0 0  Suicidal thoughts 0 0 0 0 0  PHQ-9 Score 2 0 1 3 6     Hypertension:  BP  Readings from Last 3 Encounters:  11/24/21 124/82  07/17/21 128/72  04/13/21 126/70    Obesity: Wt Readings from Last 3 Encounters:  11/24/21 251 lb (113.9 kg)  07/17/21 243 lb (110.2 kg)  04/13/21 247 lb (112 kg)   BMI Readings from Last 3 Encounters:  11/24/21 32.23 kg/m  07/17/21 31.20 kg/m  04/13/21 31.71 kg/m     Lipids:  Lab Results  Component Value Date   CHOL 122 08/05/2021   CHOL 106 07/02/2020   CHOL 102 09/19/2019   Lab Results  Component Value Date   HDL 44 08/05/2021   HDL 39 (L) 07/02/2020   HDL 36 (L) 09/19/2019   Lab Results  Component Value Date   LDLCALC 65 08/05/2021   LDLCALC 53 07/02/2020   LDLCALC 51 09/19/2019   Lab Results  Component Value Date   TRIG 59 08/05/2021   TRIG 56 07/02/2020  TRIG 66 09/19/2019   Lab Results  Component Value Date   CHOLHDL 2.8 08/05/2021   CHOLHDL 2.7 07/02/2020   CHOLHDL 2.8 09/19/2019   No results found for: "LDLDIRECT" Glucose:  Glucose  Date Value Ref Range Status  08/05/2021 108 (H) 70 - 99 mg/dL Final  05/24/2019 159 (H) 65 - 99 mg/dL Final   Glucose, Bld  Date Value Ref Range Status  07/02/2020 92 65 - 99 mg/dL Final    Comment:    .            Fasting reference interval .   09/19/2019 84 65 - 99 mg/dL Final    Comment:    .            Fasting reference interval .   04/19/2019 144 (H) 70 - 99 mg/dL Final    Comment:    Glucose reference range applies only to samples taken after fasting for at least 8 hours.    [place] [facility] Visit from 07/02/2020 in [facility]  AUDIT-C Score 0      Married STD testing and prevention (HIV/chl/gon/syphilis): N/A Sexual history: married, one partner, libido is good , no problems ED Hep C Screening: 06/21/18 Skin cancer: Discussed monitoring for atypical lesions Colorectal cancer: 03/11/14 Prostate cancer:  discussed USPTF Lab Results  Component Value Date   PSA Normal, 0.5 02/21/2014     Lung cancer:  Low Dose  CT Chest recommended if Age 3-80 years, 30 pack-year currently smoking OR have quit w/in 15years. Patient  no a candidate for screening   AAA: The USPSTF recommends one-time screening with ultrasonography in men ages 35 to 19 years who have ever smoked. Patient   no, a candidate for screening  ECG:  04/19/19  Vaccines:   Tdap: up to date Shingrix: N/A Pneumonia: up to date  Flu: up to date COVID-29: up to date  Advanced Care Planning: A voluntary discussion about advance care planning including the explanation and discussion of advance directives.  Discussed health care proxy and Living will, and the patient was able to identify a health care proxy as wife .  Patient does not have a living will and power of attorney of health care   Patient Active Problem List   Diagnosis Date Noted  . Insomnia due to other mental disorder 07/17/2021  . Long-term use of high-risk medication 07/17/2021  . GERD without esophagitis 07/17/2021  . Dysthymia 07/17/2021  . Dyslipidemia associated with type 2 diabetes mellitus ([place]) 05/26/2019  . Medial meniscus tear 11/08/2017  . Lateral meniscus tear 01/30/2016  . Bilateral knee pain 09/27/2014  . Dyslipidemia 09/24/2014  . Hemorrhoids, internal 09/24/2014  . Dysmetabolic syndrome 99991111  . Obesity (BMI 30.0-34.9) 09/24/2014  . Osteoarthritis of left knee 09/24/2014    Past Surgical History:  Procedure Laterality Date  . ACHILLES TENDON SURGERY Left    Past Rupture    Family History  Problem Relation Age of Onset  . Obesity Mother   . Schizophrenia Mother   . Atrial fibrillation Mother   . Diabetes Mother   . Alcohol abuse Father   . Cirrhosis Father   . Cirrhosis Sister   . Alcohol abuse Brother   . Allergic rhinitis Daughter   . Allergic rhinitis Son   . Cancer Maternal Grandmother        Colon    Social History   Socioeconomic History  . Marital status: Married    Spouse name: Kenney Houseman  . Number  of children: 2  . Years of  education: Not on file  . Highest education level: Master's degree (e.g., MA, MS, MEng, MEd, MSW, MBA)  Occupational History  . Occupation: Associate Professor: [employer]  Tobacco Use  . Smoking status: Never  . Smokeless tobacco: Never  Vaping Use  . Vaping Use: Never used  Substance and Sexual Activity  . Alcohol use: No    Alcohol/week: 0.0 standard drinks of alcohol  . Drug use: No  . Sexual activity: Yes    Partners: Female  Other Topics Concern  . 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 Difficulty of Paying Living Expenses: Not hard at all  Food Insecurity: No Food Insecurity (11/24/2021)   Hunger Vital Sign   . Worried About Programme researcher, broadcasting/film/video in the Last Year: Never true   . Ran Out of Food in the Last Year: Never true  Transportation Needs: No Transportation Needs (11/24/2021)   PRAPARE - Transportation   . Lack of Transportation (Medical): No   . Lack of Transportation (Non-Medical): No  Physical Activity: Insufficiently Active (11/24/2021)   Exercise Vital Sign   . Days of Exercise per Week: 4 days   . Minutes of Exercise per Session: 30 min  Stress: No Stress Concern Present (11/24/2021)   [employer] of Occupational Health - Occupational Stress Questionnaire   . Feeling of Stress : Only a little  Social Connections: Socially Integrated (11/24/2021)   Social Connection and Isolation Panel [NHANES]   . Frequency of Communication with Friends and Family: Twice a week   . Frequency of Social Gatherings with Friends and Family: Once a week   . Attends Religious Services: More than 4 times per year   . Active Member of Clubs or Organizations: Yes   . Attends Banker Meetings: 1 to 4 times per year   . Marital  Status: Married  Catering manager Violence: Not At Risk (11/24/2021)   Humiliation, Afraid, Rape, and Kick questionnaire   . Fear of Current or Ex-Partner: No   . Emotionally Abused: No   . Physically Abused: No   . Sexually Abused: No     Current Outpatient Medications:  .  rosuvastatin (CRESTOR) 5 MG tablet, TAKE 1 TABLET BY MOUTH DAILY. WITH COQ10 TO PREVENT CRAMPING, Disp: 90 tablet, Rfl: 1  No Known Allergies   ROS  Constitutional: Negative for fever or weight change.  Respiratory: Negative for cough and shortness of breath.   Cardiovascular: Negative for chest pain or palpitations.  Gastrointestinal: Negative for abdominal pain, no bowel changes.  Musculoskeletal: Negative for gait problem or joint swelling.  Skin: Negative for rash.  Neurological: Negative for dizziness or headache.  No other specific complaints in a complete review of systems (except as listed in HPI above).    Objective  Vitals:   11/24/21 0856  BP: 124/82  Pulse: 98  Resp: 16  SpO2: 99%  Weight: 251 lb (113.9 kg)  Height: 6\' 2"  (1.88 m)    Body mass index is 32.23 kg/m.  Physical Exam  Constitutional: Patient appears well-developed and well-nourished. No distress.  HENT: Head: Normocephalic and atraumatic. Ears: B TMs ok, no erythema or effusion;  Nose: Nose normal. Mouth/Throat: Oropharynx is clear and moist. No oropharyngeal exudate.  Eyes: Conjunctivae and EOM are normal. Pupils are equal, round, and reactive to light. No scleral icterus.  Neck: Normal range of motion. Neck supple. No JVD present. No thyromegaly present.  Cardiovascular: Normal rate, regular rhythm and normal heart sounds.  No murmur heard. No BLE edema. Pulmonary/Chest: Effort normal and breath sounds normal. No respiratory distress. Abdominal: Soft. Bowel sounds are normal, no distension. There is no tenderness. no masses MALE GENITALIA: Normal descended testes bilaterally, no masses palpated, no hernias, no lesions,  no discharge RECTAL: N/A Musculoskeletal: Normal range of motion, no joint effusions. No gross deformities Neurological: he is alert and oriented to person, place, and time. No cranial nerve deficit. Coordination, balance, strength, speech and gait are normal.  Skin: Skin is warm and dry. No rash noted. No erythema.  Psychiatric: Patient has a normal mood and affect. behavior is normal. Judgment and thought content normal.    Fall Risk:    11/24/2021    8:55 AM 07/17/2021    9:30 AM 04/13/2021    8:42 AM 10/14/2020   11:15 AM 07/02/2020    7:54 AM  Fall Risk   Falls in the past year? 0 0 0 0 1  Number falls in past yr: 0 0 0 0 0  Injury with Fall? 0 0 0 0 0  Risk for fall due to : No Fall Risks No Fall Risks No Fall Risks No Fall Risks   Follow up Falls prevention discussed Falls prevention discussed Falls prevention discussed Falls prevention discussed      Functional Status Survey: Is the patient deaf or have difficulty hearing?: No Does the patient have difficulty seeing, even when wearing glasses/contacts?: Yes Does the patient have difficulty concentrating, remembering, or making decisions?: No Does the patient have difficulty walking or climbing stairs?: Yes Does the patient have difficulty dressing or bathing?: No Does the patient have difficulty doing errands alone such as visiting a doctor's office or shopping?: No    Assessment & Plan  1. Well adult exam    -Prostate cancer screening and PSA options (with potential risks and benefits of testing vs not testing) were discussed along with recent recs/guidelines. -USPSTF grade A and B recommendations reviewed with patient; age-appropriate recommendations, preventive care, screening tests, etc discussed and encouraged; healthy living encouraged; see AVS for patient education given to patient -Discussed importance of 150 minutes of physical activity weekly, eat two servings of fish weekly, eat one serving of tree nuts (  cashews, pistachios, pecans, almonds.Marland Kitchen) every other day, eat 6 servings of fruit/vegetables daily and drink plenty of water and avoid sweet beverages.  -Reviewed Health Maintenance: yes

## 2021-11-24 ENCOUNTER — Ambulatory Visit (INDEPENDENT_AMBULATORY_CARE_PROVIDER_SITE_OTHER): Payer: BC Managed Care – PPO | Admitting: Family Medicine

## 2021-11-24 ENCOUNTER — Encounter: Payer: Self-pay | Admitting: Family Medicine

## 2021-11-24 VITALS — BP 124/82 | HR 98 | Resp 16 | Ht 74.0 in | Wt 251.0 lb

## 2021-11-24 DIAGNOSIS — Z0000 Encounter for general adult medical examination without abnormal findings: Secondary | ICD-10-CM

## 2021-11-24 LAB — HM DIABETES EYE EXAM

## 2021-11-24 NOTE — Patient Instructions (Signed)

## 2021-12-01 ENCOUNTER — Telehealth: Payer: Self-pay

## 2021-12-01 NOTE — Telephone Encounter (Signed)
Called and lvm to inform patient diabetic eye exam results are back. Negative for diabetic retinopathy.

## 2021-12-16 ENCOUNTER — Other Ambulatory Visit: Payer: Self-pay | Admitting: Internal Medicine

## 2021-12-16 DIAGNOSIS — E1169 Type 2 diabetes mellitus with other specified complication: Secondary | ICD-10-CM

## 2021-12-16 NOTE — Telephone Encounter (Signed)
Requested Prescriptions  Pending Prescriptions Disp Refills   rosuvastatin (CRESTOR) 5 MG tablet [Pharmacy Med Name: ROSUVASTATIN CALCIUM 5 MG TAB] 90 tablet 1    Sig: TAKE 1 TABLET BY MOUTH DAILY. WITH COQ10 TO PREVENT CRAMPING     Cardiovascular:  Antilipid - Statins 2 Failed - 12/16/2021  2:39 AM      Failed - Lipid Panel in normal range within the last 12 months    Cholesterol, Total  Date Value Ref Range Status  08/05/2021 122 100 - 199 mg/dL Final   LDL Cholesterol (Calc)  Date Value Ref Range Status  07/02/2020 53 mg/dL (calc) Final    Comment:    Reference range: <100 . Desirable range <100 mg/dL for primary prevention;   <70 mg/dL for patients with CHD or diabetic patients  with > or = 2 CHD risk factors. Marland Kitchen LDL-C is now calculated using the Martin-Hopkins  calculation, which is a validated novel method providing  better accuracy than the Friedewald equation in the  estimation of LDL-C.  Horald Pollen et al. Lenox Ahr. 0630;160(10): 2061-2068  (http://education.[facility].com/faq/FAQ164)    LDL Chol Calc (NIH)  Date Value Ref Range Status  08/05/2021 65 0 - 99 mg/dL Final   HDL  Date Value Ref Range Status  08/05/2021 44 >39 mg/dL Final   Triglycerides  Date Value Ref Range Status  08/05/2021 59 0 - 149 mg/dL Final         Passed - Cr in normal range and within 360 days    Creat  Date Value Ref Range Status  07/02/2020 1.28 0.60 - 1.35 mg/dL Final   Creatinine, Ser  Date Value Ref Range Status  08/05/2021 1.09 0.76 - 1.27 mg/dL Final   Creatinine, Urine  Date Value Ref Range Status  07/02/2020 143 20 - 320 mg/dL Final         Passed - Patient is not pregnant      Passed - Valid encounter within last 12 months    Recent Outpatient Visits           3 weeks ago Well adult exam   [facility], Danna Hefty, MD   5 months ago Dyslipidemia associated with type 2 diabetes mellitus [facility])   [facility] Alba Cory, MD   8 months ago Dyslipidemia associated with type 2 diabetes mellitus [facility])   [facility] Alba Cory, MD   1 year ago Diabetes mellitus type 2 in obese [facility])   [facility] Alba Cory, MD   1 year ago Well adult exam   [facility] Alba Cory, MD       Future Appointments             In 1 month Alba Cory, MD [facility], [facility]   In 11 months Alba Cory, MD [facility], [facility]

## 2021-12-18 DIAGNOSIS — E119 Type 2 diabetes mellitus without complications: Secondary | ICD-10-CM | POA: Diagnosis not present

## 2022-01-15 NOTE — Progress Notes (Unsigned)
Name: Jeffrey Schneider   MRN: [id number]    DOB: November 07, 1970   Date:01/15/2022       Progress Note  Subjective  Chief Complaint  Follow Up  HPI  Diabetes type II : he has a long history of pre-diabetes, on 05/24/2019 his A1C went up to 8.2 %, fasting glucose was elevated at 159. He has obesity and hyperlipidemia associated with DM. He is doing great since diagnosis, he has lost weight and has been exercising on a regular basis. Plays basketball twice a week and also more cardio and weight training .  He is taking Rosuvastatin and last LDL was at goal , we will recheck labs today    Dyslipidemia: he is on medication now, he is eating healthier, avoiding fast food , last LDL was at goal at 53, triglycerides was elevated He has resumed regular physical activity since last visit   Knee pain: he has always been active, has crepitus on both knees and legs aches at night, but worse after he plays basketball, he is not interested in seeing ortho. He also has pain going up stairs. Discussed strength training and referral to Ortho. He would like to defer referral for now  Cardiac calcium score: he asked me about it, explained it is for screening and not covered by insurance, he will think about it   Patient Active Problem List   Diagnosis Date Noted   Insomnia due to other mental disorder 07/17/2021   Long-term use of high-risk medication 07/17/2021   GERD without esophagitis 07/17/2021   Dysthymia 07/17/2021   Dyslipidemia associated with type 2 diabetes mellitus ([place]) 05/26/2019   Medial meniscus tear 11/08/2017   Lateral meniscus tear 01/30/2016   Bilateral knee pain 09/27/2014   Dyslipidemia 09/24/2014   Hemorrhoids, internal 99991111   Dysmetabolic syndrome 99991111   Obesity (BMI 30.0-34.9) 09/24/2014   Osteoarthritis of left knee 09/24/2014    Past Surgical History:  Procedure Laterality Date   ACHILLES TENDON SURGERY Left    Past Rupture    Family History  Problem  Relation Age of Onset   Obesity Mother    Schizophrenia Mother    Atrial fibrillation Mother    Diabetes Mother    Alcohol abuse Father    Cirrhosis Father    Cirrhosis Sister    Alcohol abuse Brother    Allergic rhinitis Daughter    Allergic rhinitis Son    Cancer Maternal Grandmother        Colon    Social History   Tobacco Use   Smoking status: Never   Smokeless tobacco: Never  Substance Use Topics   Alcohol use: No    Alcohol/week: 0.0 standard drinks of alcohol     Current Outpatient Medications:    rosuvastatin (CRESTOR) 5 MG tablet, TAKE 1 TABLET BY MOUTH DAILY. WITH COQ10 TO PREVENT CRAMPING, Disp: 90 tablet, Rfl: 1  No Known Allergies  I personally reviewed active problem list, medication list, allergies, family history, social history, health maintenance with the patient/caregiver today.   ROS  ***  Objective  There were no vitals filed for this visit.  There is no height or weight on file to calculate BMI.  Physical Exam ***  Recent Results (from the past 2160 hour(s))  HM DIABETES EYE EXAM     Status: None   Collection Time: 11/24/21  9:09 AM  Result Value Ref Range   HM Diabetic Eye Exam No Retinopathy No Retinopathy    Diabetic Foot Exam: Diabetic Foot  Exam - Simple   No data filed    ***  PHQ2/9:    11/24/2021    8:55 AM 07/17/2021    9:30 AM 04/13/2021    8:42 AM 10/14/2020   11:15 AM 07/02/2020    7:54 AM  Depression screen PHQ 2/9  Decreased Interest 1 0 0 0 1  Down, Depressed, Hopeless 1 0 0 0 1  PHQ - 2 Score 2 0 0 0 2  Altered sleeping 0 0 1 3 3   Tired, decreased energy 0 0 0 0 1  Change in appetite 0 0 0 0 0  Feeling bad or failure about yourself  0 0 0 0 0  Trouble concentrating 0 0 0 0 0  Moving slowly or fidgety/restless 0 0 0 0 0  Suicidal thoughts 0 0 0 0 0  PHQ-9 Score 2 0 1 3 6     phq 9 is {gen pos   Fall Risk:    11/24/2021    8:55 AM 07/17/2021    9:30 AM 04/13/2021    8:42 AM 10/14/2020    11:15 AM 07/02/2020    7:54 AM  Fall Risk   Falls in the past year? 0 0 0 0 1  Number falls in past yr: 0 0 0 0 0  Injury with Fall? 0 0 0 0 0  Risk for fall due to : No Fall Risks No Fall Risks No Fall Risks No Fall Risks   Follow up Falls prevention discussed Falls prevention discussed Falls prevention discussed Falls prevention discussed       Functional Status Survey:      Assessment & Plan  *** There are no diagnoses linked to this encounter.

## 2022-01-18 DIAGNOSIS — E119 Type 2 diabetes mellitus without complications: Secondary | ICD-10-CM | POA: Diagnosis not present

## 2022-01-19 ENCOUNTER — Encounter: Payer: Self-pay | Admitting: Family Medicine

## 2022-01-19 ENCOUNTER — Ambulatory Visit: Payer: BC Managed Care – PPO | Admitting: Family Medicine

## 2022-01-19 VITALS — BP 116/82 | HR 82 | Resp 16 | Ht 74.0 in | Wt 252.0 lb

## 2022-01-19 DIAGNOSIS — E785 Hyperlipidemia, unspecified: Secondary | ICD-10-CM

## 2022-01-19 DIAGNOSIS — M170 Bilateral primary osteoarthritis of knee: Secondary | ICD-10-CM | POA: Diagnosis not present

## 2022-01-19 DIAGNOSIS — E1169 Type 2 diabetes mellitus with other specified complication: Secondary | ICD-10-CM | POA: Diagnosis not present

## 2022-01-19 DIAGNOSIS — E669 Obesity, unspecified: Secondary | ICD-10-CM

## 2022-01-19 DIAGNOSIS — K219 Gastro-esophageal reflux disease without esophagitis: Secondary | ICD-10-CM

## 2022-01-19 LAB — POCT GLYCOSYLATED HEMOGLOBIN (HGB A1C): Hemoglobin A1C: 6 % — AB (ref 4.0–5.6)

## 2022-02-18 DIAGNOSIS — E119 Type 2 diabetes mellitus without complications: Secondary | ICD-10-CM | POA: Diagnosis not present

## 2022-02-27 ENCOUNTER — Encounter: Payer: Self-pay | Admitting: Family Medicine

## 2022-03-01 DIAGNOSIS — M25562 Pain in left knee: Secondary | ICD-10-CM | POA: Diagnosis not present

## 2022-03-01 DIAGNOSIS — M1711 Unilateral primary osteoarthritis, right knee: Secondary | ICD-10-CM | POA: Diagnosis not present

## 2022-03-03 LAB — HM DIABETES EYE EXAM

## 2022-03-19 DIAGNOSIS — E119 Type 2 diabetes mellitus without complications: Secondary | ICD-10-CM | POA: Diagnosis not present

## 2022-04-19 DIAGNOSIS — E119 Type 2 diabetes mellitus without complications: Secondary | ICD-10-CM | POA: Diagnosis not present

## 2022-05-19 DIAGNOSIS — E119 Type 2 diabetes mellitus without complications: Secondary | ICD-10-CM | POA: Diagnosis not present

## 2022-06-16 ENCOUNTER — Other Ambulatory Visit: Payer: Self-pay | Admitting: Family Medicine

## 2022-06-16 DIAGNOSIS — E1169 Type 2 diabetes mellitus with other specified complication: Secondary | ICD-10-CM

## 2022-06-16 NOTE — Telephone Encounter (Signed)
Consult at next visit

## 2022-06-19 DIAGNOSIS — E119 Type 2 diabetes mellitus without complications: Secondary | ICD-10-CM | POA: Diagnosis not present

## 2022-06-28 ENCOUNTER — Encounter: Payer: Self-pay | Admitting: Family Medicine

## 2022-06-28 ENCOUNTER — Other Ambulatory Visit: Payer: Self-pay | Admitting: Family Medicine

## 2022-06-28 ENCOUNTER — Other Ambulatory Visit: Payer: Self-pay

## 2022-06-28 DIAGNOSIS — E1169 Type 2 diabetes mellitus with other specified complication: Secondary | ICD-10-CM

## 2022-06-28 DIAGNOSIS — E669 Obesity, unspecified: Secondary | ICD-10-CM

## 2022-06-28 MED ORDER — ROSUVASTATIN CALCIUM 5 MG PO TABS: 5.0000 mg | ORAL_TABLET | Freq: Every day | ORAL | 0 refills | Status: DC

## 2022-07-19 DIAGNOSIS — E119 Type 2 diabetes mellitus without complications: Secondary | ICD-10-CM | POA: Diagnosis not present

## 2022-07-21 NOTE — Progress Notes (Signed)
Name: Jeffrey Schneider   MRN: [id number]    DOB: 1970/05/26   Date:07/26/2022       Progress Note  Subjective  Chief Complaint  Follow Up  HPI  Diabetes type II : he has a long history of pre-diabetes, on 05/24/2019 his A1C went up to 8.2 %, fasting glucose was elevated at 159. He has obesity and hyperlipidemia associated with DM. He is doing great since diagnosis, he  lost weight and has been exercising on a regular basis, A1C is at goal at 6.1 % Plays is not playing basketball as often but still once or twice a week. He is taking Rosuvastatin and last LDL was at goal, we will recheck labs today. FSBS at home between 102-120's Eye exam is up to date    Dyslipidemia: he is on medication now, he is eating healthier, avoiding fast food , last LDL was at goal at 53, triglycerides was elevated He has resumed regular physical activity since last visit . We will recheck labs today   OA : he has always been active, has crepitus on both knees and legs aches at nigh -he was seen by Ortho at [facility]. He has x-rays, he takes Advil once or twice a week, advised he can also take tylenol prn   Cardiac calcium score: he would like to have it done , he is aware usually not covered by insurance   GERD: only seldom has symptoms, doing well at this time  Dysthmia: he states he feels like it is because he is about to turn 52, stress at work and worries.   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   GERD without esophagitis 07/17/2021   Dysthymia 07/17/2021   Dyslipidemia associated with type 2 diabetes mellitus (HCC) 05/26/2019   Medial meniscus tear 11/08/2017   Lateral meniscus tear 01/30/2016   Bilateral knee pain 09/27/2014   Dyslipidemia 09/24/2014   Hemorrhoids, internal 09/24/2014   Dysmetabolic syndrome 09/24/2014   Obesity (BMI 30.0-34.9) 09/24/2014   Osteoarthritis of left  knee 09/24/2014    Past Surgical History:  Procedure Laterality Date   ACHILLES TENDON SURGERY Left    Past Rupture    Family History  Problem Relation Age of Onset   Obesity Mother    Schizophrenia Mother    Atrial fibrillation Mother    Diabetes Mother    Alcohol abuse Father    Cirrhosis Father    Cirrhosis Sister    Alcohol abuse Brother    Allergic rhinitis Daughter    Allergic rhinitis Son    Cancer Maternal Grandmother        Colon    Social History   Tobacco Use   Smoking status: Never   Smokeless tobacco: Never  Substance Use Topics   Alcohol use: No    Alcohol/week: 0.0 standard drinks of alcohol     Current Outpatient Medications:    aspirin EC 81 MG tablet, Take 81 mg by mouth daily. Swallow whole., Disp: , Rfl:    rosuvastatin (CRESTOR) 5 MG tablet, Take 1 tablet (5 mg total) by mouth daily., Disp: 90 tablet, Rfl: 0  No Known Allergies  I personally reviewed active problem list, medication list, allergies, family history, social history, health maintenance with the patient/caregiver today.   ROS  Ten systems reviewed and is negative except as mentioned in HPI   Objective  Vitals:  07/26/22 0750  BP: 122/74  Pulse: 60  Resp: 16  Temp: 97.8 F (36.6 C)  TempSrc: Oral  SpO2: 97%  Weight: 252 lb 11.2 oz (114.6 kg)  Height: 6\' 2"  (1.88 m)    Body mass index is 32.44 kg/m.  Physical Exam  Constitutional: Patient appears well-developed and well-nourished. Obese  No distress.  HEENT: head atraumatic, normocephalic, pupils equal and reactive to light,, neck supple Cardiovascular: Normal rate, regular rhythm and normal heart sounds.  No murmur heard. No BLE edema. Pulmonary/Chest: Effort normal and breath sounds normal. No respiratory distress. Abdominal: Soft.  There is no tenderness. Muscular skeletal: crepitus with extension of both knees, right worse than left  Psychiatric: Patient has a normal mood and affect. behavior is normal.  Judgment and thought content normal.   Recent Results (from the past 2160 hour(s))  POCT HgB A1C     Status: Abnormal   Collection Time: 07/26/22  8:00 AM  Result Value Ref Range   Hemoglobin A1C 6.1 (A) 4.0 - 5.6 %   HbA1c POC (<> result, manual entry)     HbA1c, POC (prediabetic range)     HbA1c, POC (controlled diabetic range)       PHQ2/9:    07/26/2022    7:53 AM 07/26/2022    7:47 AM 01/19/2022    8:24 AM 11/24/2021    8:55 AM 07/17/2021    9:30 AM  Depression screen PHQ 2/9  Decreased Interest 1 0 0 1 0  Down, Depressed, Hopeless 1 0 0 1 0  PHQ - 2 Score 2 0 0 2 0  Altered sleeping 0 0 1 0 0  Tired, decreased energy 1 0 1 0 0  Change in appetite 0 0 0 0 0  Feeling bad or failure about yourself  0 0 0 0 0  Trouble concentrating 0 0 0 0 0  Moving slowly or fidgety/restless 0 0 0 0 0  Suicidal thoughts 0 0 0 0 0  PHQ-9 Score 3 0 2 2 0  Difficult doing work/chores Not difficult at all Not difficult at all       phq 9 is positive   Fall Risk:    07/26/2022    7:53 AM 07/26/2022    7:47 AM 01/19/2022    8:24 AM 11/24/2021    8:55 AM 07/17/2021    9:30 AM  Fall Risk   Falls in the past year? 0 0 0 0 0  Number falls in past yr: 0 0 0 0 0  Injury with Fall? 0 0 0 0 0  Risk for fall due to :  No Fall Risks No Fall Risks No Fall Risks No Fall Risks  Follow up  Falls prevention discussed;Education provided;Falls evaluation completed Falls prevention discussed Falls prevention discussed Falls prevention discussed      Functional Status Survey: Is the patient deaf or have difficulty hearing?: No Does the patient have difficulty seeing, even when wearing glasses/contacts?: No Does the patient have difficulty concentrating, remembering, or making decisions?: No Does the patient have difficulty walking or climbing stairs?: Yes Does the patient have difficulty dressing or bathing?: No Does the patient have difficulty doing errands alone such as visiting a doctor's office or  shopping?: No    Assessment & Plan  1. Dyslipidemia associated with type 2 diabetes mellitus (HCC)  - POCT HgB A1C - COMPLETE METABOLIC PANEL WITH GFR - Urine Microalbumin w/creat. ratio - Lipid panel - CT CARDIAC SCORING; Future  2. Long-term  use of high-risk medication  - CBC with Differential/Platelet  3. Primary osteoarthritis of both knees  Advised tylenol prn for pain and topical medication  4. GERD without esophagitis   Doing well   5. Dysthymia  Discussed therapy

## 2022-07-26 ENCOUNTER — Ambulatory Visit: Payer: BC Managed Care – PPO | Admitting: Family Medicine

## 2022-07-26 ENCOUNTER — Encounter: Payer: Self-pay | Admitting: Family Medicine

## 2022-07-26 VITALS — BP 122/74 | HR 60 | Temp 97.8°F | Resp 16 | Ht 74.0 in | Wt 252.7 lb

## 2022-07-26 DIAGNOSIS — M170 Bilateral primary osteoarthritis of knee: Secondary | ICD-10-CM

## 2022-07-26 DIAGNOSIS — E785 Hyperlipidemia, unspecified: Secondary | ICD-10-CM

## 2022-07-26 DIAGNOSIS — Z79899 Other long term (current) drug therapy: Secondary | ICD-10-CM | POA: Diagnosis not present

## 2022-07-26 DIAGNOSIS — K219 Gastro-esophageal reflux disease without esophagitis: Secondary | ICD-10-CM | POA: Diagnosis not present

## 2022-07-26 DIAGNOSIS — E1169 Type 2 diabetes mellitus with other specified complication: Secondary | ICD-10-CM

## 2022-07-26 DIAGNOSIS — F341 Dysthymic disorder: Secondary | ICD-10-CM

## 2022-07-26 LAB — POCT GLYCOSYLATED HEMOGLOBIN (HGB A1C): Hemoglobin A1C: 6.1 % — AB (ref 4.0–5.6)

## 2022-07-27 LAB — CBC WITH DIFFERENTIAL/PLATELET
Absolute Monocytes: 366 cells/uL (ref 200–950)
Basophils Absolute: 31 cells/uL (ref 0–200)
Basophils Relative: 0.5 %
Eosinophils Absolute: 159 cells/uL (ref 15–500)
Eosinophils Relative: 2.6 %
HCT: 42.7 % (ref 38.5–50.0)
Hemoglobin: 14.4 g/dL (ref 13.2–17.1)
Lymphs Abs: 1684 cells/uL (ref 850–3900)
MCH: 29.1 pg (ref 27.0–33.0)
MCHC: 33.7 g/dL (ref 32.0–36.0)
MCV: 86.4 fL (ref 80.0–100.0)
MPV: 10.3 fL (ref 7.5–12.5)
Monocytes Relative: 6 %
Neutro Abs: 3861 cells/uL (ref 1500–7800)
Neutrophils Relative %: 63.3 %
Platelets: 189 10*3/uL (ref 140–400)
RBC: 4.94 10*6/uL (ref 4.20–5.80)
RDW: 12.9 % (ref 11.0–15.0)
Total Lymphocyte: 27.6 %
WBC: 6.1 10*3/uL (ref 3.8–10.8)

## 2022-07-27 LAB — MICROALBUMIN / CREATININE URINE RATIO
Creatinine, Urine: 182 mg/dL (ref 20–320)
Microalb Creat Ratio: 1 mg/g creat (ref <30)
Microalb, Ur: 0.2 mg/dL

## 2022-07-27 LAB — LIPID PANEL
Cholesterol: 123 mg/dL (ref <200)
HDL: 41 mg/dL (ref >=40)
LDL Cholesterol (Calc): 65 mg/dL (calc)
Non-HDL Cholesterol (Calc): 82 mg/dL (calc) (ref <130)
Total CHOL/HDL Ratio: 3 (calc) (ref <5.0)
Triglycerides: 89 mg/dL (ref <150)

## 2022-07-27 LAB — COMPLETE METABOLIC PANEL WITH GFR
AG Ratio: 1.6 (calc) (ref 1.0–2.5)
ALT: 17 U/L (ref 9–46)
AST: 14 U/L (ref 10–40)
Albumin: 4.6 g/dL (ref 3.6–5.1)
Alkaline phosphatase (APISO): 70 U/L (ref 36–130)
BUN: 21 mg/dL (ref 7–25)
CO2: 27 mmol/L (ref 20–32)
Calcium: 9.8 mg/dL (ref 8.6–10.3)
Chloride: 107 mmol/L (ref 98–110)
Creat: 1.27 mg/dL (ref 0.60–1.29)
Globulin: 2.9 g/dL (calc) (ref 1.9–3.7)
Glucose, Bld: 112 mg/dL — ABNORMAL HIGH (ref 65–99)
Potassium: 4.8 mmol/L (ref 3.5–5.3)
Sodium: 141 mmol/L (ref 135–146)
Total Bilirubin: 0.4 mg/dL (ref 0.2–1.2)
Total Protein: 7.5 g/dL (ref 6.1–8.1)
eGFR: 69 mL/min/{1.73_m2} (ref >=60)

## 2022-08-04 ENCOUNTER — Ambulatory Visit
Admission: RE | Admit: 2022-08-04 | Discharge: 2022-08-04 | Disposition: A | Discharge status: Home / Self Care | Payer: Self-pay | Source: Ambulatory Visit | Attending: Family Medicine | Admitting: Family Medicine

## 2022-08-04 DIAGNOSIS — E1169 Type 2 diabetes mellitus with other specified complication: Secondary | ICD-10-CM | POA: Insufficient documentation

## 2022-08-04 DIAGNOSIS — E785 Hyperlipidemia, unspecified: Secondary | ICD-10-CM | POA: Insufficient documentation

## 2022-08-16 ENCOUNTER — Encounter: Payer: Self-pay | Admitting: Family Medicine

## 2022-08-17 ENCOUNTER — Other Ambulatory Visit: Payer: Self-pay | Admitting: Family Medicine

## 2022-08-17 DIAGNOSIS — E1169 Type 2 diabetes mellitus with other specified complication: Secondary | ICD-10-CM

## 2022-08-17 DIAGNOSIS — R931 Abnormal findings on diagnostic imaging of heart and coronary circulation: Secondary | ICD-10-CM

## 2022-08-17 DIAGNOSIS — E785 Hyperlipidemia, unspecified: Secondary | ICD-10-CM

## 2022-08-19 DIAGNOSIS — E119 Type 2 diabetes mellitus without complications: Secondary | ICD-10-CM | POA: Diagnosis not present

## 2022-09-19 DIAGNOSIS — E119 Type 2 diabetes mellitus without complications: Secondary | ICD-10-CM | POA: Diagnosis not present

## 2022-09-22 ENCOUNTER — Other Ambulatory Visit: Payer: Self-pay | Admitting: Family Medicine

## 2022-09-22 DIAGNOSIS — E1169 Type 2 diabetes mellitus with other specified complication: Secondary | ICD-10-CM

## 2022-09-25 ENCOUNTER — Emergency Department
Admission: EM | Admit: 2022-09-25 | Discharge: 2022-09-25 | Disposition: A | Discharge status: Home / Self Care | Payer: BC Managed Care – PPO | Attending: Emergency Medicine | Admitting: Emergency Medicine

## 2022-09-25 ENCOUNTER — Other Ambulatory Visit: Payer: Self-pay

## 2022-09-25 ENCOUNTER — Emergency Department: Payer: BC Managed Care – PPO

## 2022-09-25 DIAGNOSIS — W268XXA Contact with other sharp object(s), not elsewhere classified, initial encounter: Secondary | ICD-10-CM | POA: Diagnosis not present

## 2022-09-25 DIAGNOSIS — S61213A Laceration without foreign body of left middle finger without damage to nail, initial encounter: Secondary | ICD-10-CM | POA: Insufficient documentation

## 2022-09-25 DIAGNOSIS — E119 Type 2 diabetes mellitus without complications: Secondary | ICD-10-CM | POA: Diagnosis not present

## 2022-09-25 DIAGNOSIS — S61215A Laceration without foreign body of left ring finger without damage to nail, initial encounter: Secondary | ICD-10-CM | POA: Insufficient documentation

## 2022-09-25 DIAGNOSIS — S61412A Laceration without foreign body of left hand, initial encounter: Secondary | ICD-10-CM | POA: Diagnosis not present

## 2022-09-25 DIAGNOSIS — S6992XA Unspecified injury of left wrist, hand and finger(s), initial encounter: Secondary | ICD-10-CM | POA: Diagnosis not present

## 2022-09-25 NOTE — Discharge Instructions (Addendum)
The glue will begin to fall off in 7 to 10 days.  Please try to avoid picking at it. Make sure it stays dry, you can cover with a bandage if you prefer.  Watch for signs of infection including redness, warmth, swelling and discharge.  If you develop these please return to the ED.

## 2022-09-25 NOTE — ED Notes (Signed)
Cruz Condon, PA-C at bedside providing wound care to patient's 3rd and4th fingers on left hand. Bleeding is controlled.

## 2022-09-25 NOTE — ED Triage Notes (Signed)
Pt was attempting to cut some limbs with a tree shear and got his left index and ring finger in them, laceration to both fingers.

## 2022-09-25 NOTE — ED Provider Notes (Signed)
[facility] Provider Note    Event Date/Time   First MD Initiated Contact with Patient 09/25/22 1443     (approximate)   History   Finger Injury   HPI  Jeffrey Schneider is a 52 y.o. male man with PMH as cervicalgia, diabetes, HLD who presents for evaluation of finger injury.  Patient was trimming some branches in his yard when he cut the tip of his left middle and left ring finger.  Bleeding is controlled.  Patient's last tetanus shot was in 2022.      Physical Exam   Triage Vital Signs: ED Triage Vitals  Encounter Vitals Group     BP 09/25/22 1345 134/67     Systolic BP Percentile --      Diastolic BP Percentile --      Pulse Rate 09/25/22 1345 65     Resp 09/25/22 1345 18     Temp 09/25/22 1345 98.1 F (36.7 C)     Temp Source 09/25/22 1345 Oral     SpO2 09/25/22 1345 95 %     Weight --      Height --      Head Circumference --      Peak Flow --      Pain Score 09/25/22 1346 7     Pain Loc --      Pain Education --      Exclude from Growth Chart --     Most recent vital signs: Vitals:   09/25/22 1345  BP: 134/67  Pulse: 65  Resp: 18  Temp: 98.1 F (36.7 C)  SpO2: 95%    General: Awake, no distress.  CV:  Good peripheral perfusion.  Resp:  Normal effort.  Abd:  No distention.  Other:  About 1 cm laceration to the tip of the left index finger and 1 cm laceration to the tip of the left ring finger on the palmar side, patient able to fully flex and extend his fingers, neurovascularly intact distal to the laceration, radial pulse 2+ and regular   ED Results / Procedures / Treatments   Labs (all labs ordered are listed, but only abnormal results are displayed) Labs Reviewed - No data to display  RADIOLOGY  Left hand x-rays obtained, interpreted the images as well as reviewed the radiologist report which was negative for fracture.   PROCEDURES:  Critical Care performed: No  ..Laceration Repair  Date/Time: 09/25/2022  3:23 PM  Performed by: Cameron Ali, PA-C Authorized by: Cameron Ali, PA-C   Consent:    Consent obtained:  Verbal   Consent given by:  Patient   Risks, benefits, and alternatives were discussed: yes     Risks discussed:  Infection, pain, poor cosmetic result and poor wound healing   Alternatives discussed:  No treatment Universal protocol:    Patient identity confirmed:  Verbally with patient Anesthesia:    Anesthesia method:  None Laceration details:    Location:  Finger   Finger location:  L long finger   Length (cm):  1.5   Depth (mm):  3 Pre-procedure details:    Preparation:  Imaging obtained to evaluate for foreign bodies Exploration:    Hemostasis achieved with:  Cautery   Imaging obtained: x-ray     Imaging outcome: foreign body not noted     Wound exploration: entire depth of wound visualized   Treatment:    Area cleansed with:  Povidone-iodine   Amount of cleaning:  Standard  Irrigation solution:  Sterile saline   Irrigation volume:  10 mL   Irrigation method:  Syringe Skin repair:    Repair method:  Tissue adhesive Approximation:    Approximation:  Close Repair type:    Repair type:  Simple Post-procedure details:    Dressing:  Open (no dressing)   Procedure completion:  Tolerated well, no immediate complications .Marland KitchenLaceration Repair  Date/Time: 09/25/2022 3:24 PM  Performed by: Cameron Ali, PA-C Authorized by: Cameron Ali, PA-C   Consent:    Consent obtained:  Verbal   Consent given by:  Patient   Risks, benefits, and alternatives were discussed: yes     Risks discussed:  Infection, pain, poor cosmetic result and poor wound healing   Alternatives discussed:  No treatment Universal protocol:    Patient identity confirmed:  Verbally with patient Anesthesia:    Anesthesia method:  None Laceration details:    Location:  Finger   Finger location:  L ring finger   Length (cm):  1.5   Depth (mm):  4 Pre-procedure  details:    Preparation:  Imaging obtained to evaluate for foreign bodies Exploration:    Hemostasis achieved with:  Direct pressure   Imaging outcome: foreign body not noted     Wound exploration: entire depth of wound visualized   Treatment:    Area cleansed with:  Povidone-iodine   Amount of cleaning:  Standard   Irrigation solution:  Sterile saline   Irrigation volume:  10 mL   Irrigation method:  Syringe Skin repair:    Repair method:  Tissue adhesive Approximation:    Approximation:  Close Repair type:    Repair type:  Simple Post-procedure details:    Dressing:  Adhesive bandage   Procedure completion:  Tolerated well, no immediate complications    MEDICATIONS ORDERED IN ED: Medications - No data to display   IMPRESSION / MDM / ASSESSMENT AND PLAN / ED COURSE  I reviewed the triage vital signs and the nursing notes.                             52 year old male presents for evaluation of lacerations to the tip of his left middle finger and left ring finger.  Patient was hemodynamically stable in triage, NAD on exam.  Differential diagnosis includes, but is not limited to, fracture, laceration, tendon injury.  Patient's presentation is most consistent with acute, uncomplicated illness.  Patient soaked his fingertips in solution of iodine and tap water for approximately 10 minutes.  Lacerations repaired as described in the procedure note above.  Was advised on wound care.  Patient voiced understanding, all questions were answered and he was stable at discharge.      FINAL CLINICAL IMPRESSION(S) / ED DIAGNOSES   Final diagnoses:  Laceration of left middle finger without foreign body without damage to nail, initial encounter  Laceration of left ring finger without foreign body without damage to nail, initial encounter     Rx / DC Orders   ED Discharge Orders     None        Note:  This document was prepared using Dragon voice recognition software and may  include unintentional dictation errors.   Cameron Ali, PA-C 09/25/22 1526    Trinna Post, MD 09/25/22 [phone]

## 2022-09-27 NOTE — Group Note (Deleted)

## 2022-09-28 ENCOUNTER — Other Ambulatory Visit: Payer: Self-pay

## 2022-09-28 ENCOUNTER — Encounter: Payer: Self-pay | Admitting: Physician Assistant

## 2022-09-28 ENCOUNTER — Ambulatory Visit (INDEPENDENT_AMBULATORY_CARE_PROVIDER_SITE_OTHER): Payer: Self-pay | Admitting: Physician Assistant

## 2022-09-28 VITALS — BP 110/76 | HR 78 | Temp 97.7°F

## 2022-09-28 DIAGNOSIS — S61205A Unspecified open wound of left ring finger without damage to nail, initial encounter: Secondary | ICD-10-CM

## 2022-09-28 DIAGNOSIS — S61203A Unspecified open wound of left middle finger without damage to nail, initial encounter: Secondary | ICD-10-CM

## 2022-09-28 NOTE — Progress Notes (Signed)
[facility] [address] [place], [place] [address]   Office Visit Note  Patient Name: Jeffrey Schneider Date of Birth [id number]  Medical Record number [id number]  Date of Service: 09/28/2022  Chief Complaint  Patient presents with   Acute Visit    Patient states he cut two fingers on his L hand this past weekend. He was seen at the ER where wound glue was applied. He states one of the cuts has since reopened and he would like the it evaluated to make sure there is no infection and redressed.      52 y/o M presents to the clinic for wound care. Pt was seen in the ER for lacerations to the left middle and ring fingers two days ago. He cut his fingers on electric hedge trimmers. He is up to date with tetanus. Wound edges were closed with skin glue. However, left ring finger wound has opened up again. Pt comes in for wound check and re-dressing.       Current Medication:  Outpatient Encounter Medications as of 09/28/2022  Medication Sig   aspirin EC 81 MG tablet Take 81 mg by mouth daily. Swallow whole.   rosuvastatin (CRESTOR) 5 MG tablet TAKE 1 TABLET (5 MG TOTAL) BY MOUTH DAILY.   No facility-administered encounter medications on file as of 09/28/2022.      Medical History: Past Medical History:  Diagnosis Date   Abnormal glucose    Cervicalgia    Decreased libido    Diabetes mellitus without complication (HCC)    Erectile dysfunction    Hyperlipidemia    Hypogonadism, male    Internal hemorrhoids    Left knee pain    Metabolic syndrome    Obesity    Thyroid enlarged      Vital Signs: BP 110/76   Pulse 78   Temp 97.7 F (36.5 C)   SpO2 98%    Review of Systems  Musculoskeletal: Negative.   Skin:  Positive for wound. Negative for color change.  Neurological:  Negative for weakness and numbness.    Physical Exam Constitutional:      Appearance: Normal appearance.  HENT:     Head: Normocephalic and atraumatic.     Right Ear: External ear  normal.     Left Ear: External ear normal.  Eyes:     Extraocular Movements: Extraocular movements intact.  Musculoskeletal:        General: Tenderness (at the wound sites) present. No deformity.     Comments: Left Middle finger- Wound edges well approximated with skin glue. No active bleeding. No redness or warm to touch. Sensation is grossly intact distally. ROM is within normal range. Nail intact.   Left Ring finger- open wound at the volar surface of the DP. Not actively bleeding. Wound edges is separated. No localized redness. Minimal swelling. Sensation is grossly intact distally. ROM is within normal range. Nail intact.   Skin:    General: Skin is warm and dry.     Capillary Refill: Capillary refill takes less than 2 seconds.  Neurological:     Mental Status: He is alert and oriented to person, place, and time.  Psychiatric:        Mood and Affect: Mood normal.        Behavior: Behavior normal.       Assessment/Plan:  1. Open wound of left middle finger  2. Open wound of left ring finger  Soaked left ring finger in hydrogen peroxide  solution.  Cleaned old blood around nailbed. Telfa with antibiotic dressing applied.  RTC tomorrow for wound check.  Discussed signs and symptoms of infection. Keep area clean, dry, and covered at workplace, but remove dressing at home to air-dry wound.  Pt verbalized understanding and in agreement.   General Counseling: Maclovio verbalizes understanding of the findings of todays visit and agrees with plan of treatment. I have discussed any further diagnostic evaluation that may be needed or ordered today. We also reviewed his medications today. he has been encouraged to call the office with any questions or concerns that should arise related to todays visit.    Time spent:30 Minutes    Gilberto Better, [place] Physician Assistant

## 2022-09-29 ENCOUNTER — Other Ambulatory Visit: Payer: Self-pay

## 2022-09-29 ENCOUNTER — Encounter: Payer: Self-pay | Admitting: Physician Assistant

## 2022-09-29 ENCOUNTER — Telehealth: Payer: Self-pay

## 2022-09-29 ENCOUNTER — Ambulatory Visit (INDEPENDENT_AMBULATORY_CARE_PROVIDER_SITE_OTHER): Payer: Self-pay | Admitting: Physician Assistant

## 2022-09-29 VITALS — Temp 96.8°F

## 2022-09-29 DIAGNOSIS — S61205A Unspecified open wound of left ring finger without damage to nail, initial encounter: Secondary | ICD-10-CM

## 2022-09-29 DIAGNOSIS — S61203A Unspecified open wound of left middle finger without damage to nail, initial encounter: Secondary | ICD-10-CM

## 2022-09-29 NOTE — Transitions of Care (Post Inpatient/ED Visit) (Signed)
09/29/2022  Name: Jeffrey Schneider MRN: [id number] DOB: 1970/01/25  Today's TOC FU Call Status: Today's TOC FU Call Status:: Successful TOC FU Call Completed TOC FU Call Complete Date: 09/29/22 Patient's Name and Date of Birth confirmed.  Red on EMMI-ED Discharge Alert Date & Reason:09/27/22 "Scheduled follow-up appt? No"   Transition Care Management Follow-up Telephone Call Date of Discharge: 09/25/22 Discharge Facility: [facility] Type of Discharge: Emergency Department Reason for ED Visit: Other: ("laceration of left middle finger without foreign body without damage to nail") How have you been since you were released from the hospital?: Better (Pt at work-states finger healing-looking better-glue came off one area-he saw MD at [facility]-where he works and area repaired. States he has seen MD again for f/u and told area healing & improving.) Any questions or concerns?: No  Items Reviewed: Did you receive and understand the discharge instructions provided?: Yes Medications obtained,verified, and reconciled?: Yes (Medications Reviewed) Any new allergies since your discharge?: No Dietary orders reviewed?: NA Do you have support at home?: Yes People in Home: spouse Name of Support/Comfort Primary Source: [name]  Medications Reviewed Today: Medications Reviewed Today     Reviewed by Charlyn Minerva, RN (Registered Nurse) on 09/29/22 at 1358  Med List Status: <None>   Medication Order Taking? Sig Documenting Provider Last Dose Status Informant  aspirin EC 81 MG tablet [id number] Yes Take 81 mg by mouth daily. Swallow whole. [provider] Taking Active Self  rosuvastatin (CRESTOR) 5 MG tablet [id number] Yes TAKE 1 TABLET (5 MG TOTAL) BY MOUTH DAILY. Alba Cory, MD Taking Active             Home Care and Equipment/Supplies: Were Home Health Services Ordered?: NA Any new equipment or medical supplies ordered?:  NA  Functional Questionnaire: Do you need assistance with bathing/showering or dressing?: No Do you need assistance with meal preparation?: No Do you need assistance with eating?: No Do you have difficulty maintaining continence: No Do you need assistance with getting out of bed/getting out of a chair/moving?: No Do you have difficulty managing or taking your medications?: No  Follow up appointments reviewed: PCP Follow-up appointment confirmed?: NA Specialist Hospital Follow-up appointment confirmed?: Yes Date of Specialist follow-up appointment?: 09/29/22 Follow-Up Specialty Provider:: Dr. Maryjean Morn saw MD where he works at Do you need transportation to your follow-up appointment?: No Do you understand care options if your condition(s) worsen?: Yes-patient verbalized understanding  SDOH Interventions Today    Flowsheet Row Most Recent Value  SDOH Interventions   Food Insecurity Interventions Intervention Not Indicated  Transportation Interventions Intervention Not Indicated      TOC Interventions Today    Flowsheet Row Most Recent Value  TOC Interventions   TOC Interventions Discussed/Reviewed TOC Interventions Discussed, Post op wound/incision care, S/S of infection      Interventions Today    Flowsheet Row Most Recent Value  General Interventions   General Interventions Discussed/Reviewed General Interventions Discussed, Doctor Visits  Doctor Visits Discussed/Reviewed Doctor Visits Discussed, PCP  PCP/Specialist Visits Compliance with follow-up visit  Education Interventions   Education Provided Provided Education  Provided Verbal Education On When to see the doctor, Nutrition, Medication, Other  Nutrition Interventions   Nutrition Discussed/Reviewed Nutrition Discussed  Pharmacy Interventions   Pharmacy Dicussed/Reviewed Pharmacy Topics Discussed  Safety Interventions   Safety Discussed/Reviewed Safety Discussed       Alessandra Grout [facility]  Health/[facility] Care Management Care Management Community Coordinator Direct Phone: [phone] Toll Free:  [phone] Fax: [phone]

## 2022-09-29 NOTE — Progress Notes (Signed)
[facility] [address] [place], [place] [address]   Office Visit Note  Patient Name: Jeffrey Schneider Date of Birth [id number]  Medical Record number [id number]  Date of Service: 09/29/2022  Chief Complaint  Patient presents with   Follow-up   Wound Check     52 y/o M presents to the clinic for a f/u  for wound check. Doing well. Has continued to take wound care precautions. Kept it covered at workplace, but air-dry wound at home. Denies drainage from the wound or fever/chills.       Current Medication:  Outpatient Encounter Medications as of 09/29/2022  Medication Sig   aspirin EC 81 MG tablet Take 81 mg by mouth daily. Swallow whole.   rosuvastatin (CRESTOR) 5 MG tablet TAKE 1 TABLET (5 MG TOTAL) BY MOUTH DAILY.   No facility-administered encounter medications on file as of 09/29/2022.      Medical History: Past Medical History:  Diagnosis Date   Abnormal glucose    Cervicalgia    Decreased libido    Diabetes mellitus without complication (HCC)    Erectile dysfunction    Hyperlipidemia    Hypogonadism, male    Internal hemorrhoids    Left knee pain    Metabolic syndrome    Obesity    Thyroid enlarged      Vital Signs: Temp (!) 96.8 F (36 C)    Review of Systems  Constitutional: Negative.   Skin:  Positive for wound.    Physical Exam Constitutional:      Appearance: Normal appearance.  HENT:     Head: Normocephalic and atraumatic.     Right Ear: External ear normal.     Left Ear: External ear normal.  Eyes:     Extraocular Movements: Extraocular movements intact.  Skin:    General: Skin is warm and dry.     Comments: Left Middle finger: slight blanching of the skin at the upper edge of the wound. No drainage. No red streaks.  Left Ring Finger: Wound closing well with wound edges slightly still open. No bloody or purulent discharge. No red streaks.   Neurological:     Mental Status: He is alert and oriented to person, place, and  time.  Psychiatric:        Mood and Affect: Mood normal.        Behavior: Behavior normal.       Assessment/Plan:  1. Open wound of left ring finger  2. Open wound of left middle finger  Continue with wound precautions as previously instructed. RTC tomorrow for wound check. Band-aids and antibiotic dressing applied.  General Counseling: Jeffrey Schneider verbalizes understanding of the findings of todays visit and agrees with plan of treatment. I have discussed any further diagnostic evaluation that may be needed or ordered today. We also reviewed his medications today. he has been encouraged to call the office with any questions or concerns that should arise related to todays visit.    Time spent:20 Minutes    Gilberto Better, [place] Physician Assistant

## 2022-09-30 ENCOUNTER — Ambulatory Visit: Payer: Self-pay | Admitting: Physician Assistant

## 2022-10-19 DIAGNOSIS — E119 Type 2 diabetes mellitus without complications: Secondary | ICD-10-CM | POA: Diagnosis not present

## 2022-10-19 DIAGNOSIS — Z23 Encounter for immunization: Secondary | ICD-10-CM | POA: Diagnosis not present

## 2022-10-25 ENCOUNTER — Ambulatory Visit: Payer: BC Managed Care – PPO | Attending: Cardiology | Admitting: Cardiology

## 2022-10-25 ENCOUNTER — Encounter: Payer: Self-pay | Admitting: Cardiology

## 2022-10-25 VITALS — BP 124/72 | HR 66 | Ht 74.5 in | Wt 251.2 lb

## 2022-10-25 DIAGNOSIS — E7800 Pure hypercholesterolemia, unspecified: Secondary | ICD-10-CM

## 2022-10-25 DIAGNOSIS — I2510 Atherosclerotic heart disease of native coronary artery without angina pectoris: Secondary | ICD-10-CM | POA: Diagnosis not present

## 2022-10-25 NOTE — Progress Notes (Signed)
Cardiology Office Note:    Date:  10/25/2022   ID:  Jeffrey Schneider, DOB 1970/12/07, MRN [id number]  PCP:  Alba Cory, MD   [name] Providers Cardiologist:  Debbe Odea, MD     Referring MD: Alba Cory, MD   Chief Complaint  Patient presents with   New Patient (Initial Visit)    Referred for cardiac devaluation of High coronary artery calcium score with no personal cardiac history.     Jeffrey Schneider is a 52 y.o. male who is being seen today for the evaluation of coronary calcifications at the request of Alba Cory, MD.   History of Present Illness:    Jeffrey Schneider is a 52 y.o. male with a hx of hyperlipidemia, coronary calcifications who presents for cardiac evaluation.  He had a coronary calcium score/scan performed 08/04/2022, calcium score of 12.2/73rd percentile noted.  He denies chest pain or shortness of breath.  Very active with playing basketball several days weekly without any symptoms.  Denies any family history of heart disease.  Compliant with Crestor as prescribed for hyperlipidemia, also takes a baby aspirin.  Past Medical History:  Diagnosis Date   Abnormal glucose    Cervicalgia    Decreased libido    Diabetes mellitus without complication (HCC)    Erectile dysfunction    Hyperlipidemia    Hypogonadism, male    Internal hemorrhoids    Left knee pain    Metabolic syndrome    Obesity    Thyroid enlarged     Past Surgical History:  Procedure Laterality Date   ACHILLES TENDON SURGERY Left    Past Rupture    Current Medications: Current Meds  Medication Sig   aspirin EC 81 MG tablet Take 81 mg by mouth daily. Swallow whole.   rosuvastatin (CRESTOR) 5 MG tablet TAKE 1 TABLET (5 MG TOTAL) BY MOUTH DAILY.     Allergies:   Patient has no known allergies.   Social History   Socioeconomic History   Marital status: Married    Spouse name: [name]   Number of children: 2   Years of education: Not on file    Highest education level: Master's degree (e.g., MA, MS, MEng, MEd, MSW, MBA)  Occupational History   Occupation: Associate Professor: [employer]  Tobacco Use   Smoking status: Never   Smokeless tobacco: Never  Vaping Use   Vaping status: Never Used  Substance and Sexual Activity   Alcohol use: No    Alcohol/week: 0.0 standard drinks of alcohol   Drug use: No   Sexual activity: Yes    Partners: Female  Other Topics Concern   Not on file  Social History Narrative   Married   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   Two children, still at home ( teenagers )    Social Determinants of Health   Financial Resource Strain: Low Risk  (07/25/2022)   Overall Financial Resource Strain (CARDIA)    Difficulty of Paying Living Expenses: Not hard at all  Food Insecurity: No Food Insecurity (09/29/2022)   Hunger Vital Sign    Worried About Running Out of Food in the Last Year: Never true    Ran Out of Food in the Last Year: Never true  Transportation Needs: No Transportation Needs (09/29/2022)   PRAPARE - Administrator, Civil Service (Medical): No    Lack of Transportation (Non-Medical): No  Physical Activity: Insufficiently Active (07/25/2022)  Exercise Vital Sign    Days of Exercise per Week: 4 days    Minutes of Exercise per Session: 30 min  Stress: Stress Concern Present (07/25/2022)   [employer] of Occupational Health - Occupational Stress Questionnaire    Feeling of Stress : To some extent  Social Connections: Moderately Integrated (07/25/2022)   Social Connection and Isolation Panel [NHANES]    Frequency of Communication with Friends and Family: Once a week    Frequency of Social Gatherings with Friends and Family: Once a week    Attends Religious Services: 1 to 4 times per year    Active Member of [employer] or Organizations: Patient declined    Attends Engineer, structural: 1 to 4 times per year    Marital Status: Married     Family  History: The patient's family history includes Alcohol abuse in his brother and father; Allergic rhinitis in his daughter and son; Atrial fibrillation in his mother; Cancer in his maternal grandmother; Cirrhosis in his father and sister; Diabetes in his mother; Obesity in his mother; Schizophrenia in his mother.  ROS:   Please see the history of present illness.     All other systems reviewed and are negative.  EKGs/Labs/Other Studies Reviewed:    The following studies were reviewed today:  EKG Interpretation Date/Time:  Monday October 25 2022 08:47:15 EDT Ventricular Rate:  66 PR Interval:  154 QRS Duration:  92 QT Interval:  386 QTC Calculation: 404 R Axis:   7  Text Interpretation: Normal sinus rhythm Minimal voltage criteria for LVH, may be normal variant ( R in aVL ) Confirmed by Debbe Odea ([id number]) on 10/25/2022 8:55:35 AM    Recent Labs: 07/26/2022: ALT 17; BUN 21; Creat 1.27; Hemoglobin 14.4; Platelets 189; Potassium 4.8; Sodium 141  Recent Lipid Panel    Component Value Date/Time   CHOL 123 07/26/2022 0820   CHOL 122 08/05/2021 0839   TRIG 89 07/26/2022 0820   HDL 41 07/26/2022 0820   HDL 44 08/05/2021 0839   CHOLHDL 3.0 07/26/2022 0820   VLDL 15 09/02/2015 1049   LDLCALC 65 07/26/2022 0820     Risk Assessment/Calculations:               Physical Exam:    VS:  BP 124/72 (BP Location: Right Arm, Patient Position: Sitting, Cuff Size: Large)   Pulse 66   Ht 6' 2.5" (1.892 m)   Wt 251 lb 3.2 oz (113.9 kg)   SpO2 99%   BMI 31.82 kg/m     Wt Readings from Last 3 Encounters:  10/25/22 251 lb 3.2 oz (113.9 kg)  07/26/22 252 lb 11.2 oz (114.6 kg)  01/19/22 252 lb (114.3 kg)     GEN:  Well nourished, well developed in no acute distress HEENT: Normal NECK: No JVD; No carotid bruits CARDIAC: RRR, no murmurs, rubs, gallops RESPIRATORY:  Clear to auscultation without rales, wheezing or rhonchi  ABDOMEN: Soft, non-tender, non-distended MUSCULOSKELETAL:   No edema; No deformity  SKIN: Warm and dry NEUROLOGIC:  Alert and oriented x 3 PSYCHIATRIC:  Normal affect   ASSESSMENT:    1. Coronary artery calcification   2. Pure hypercholesterolemia   3. ASCVD (arteriosclerotic cardiovascular disease)    PLAN:    In order of problems listed above:  Minimal coronary calcification, calcium score 12.2.  Denies chest pain or shortness of breath.  LDL adequately controlled.  Continue aspirin, Crestor 5 mg daily.  Will obtain echo to rule out any  structural abnormalities.  Patient is otherwise asymptomatic, no indication for ischemic workup at this time. Hyperlipidemia, cholesterol controlled.  Continue Crestor 5 mg daily.  Follow-up after echo.      Medication Adjustments/Labs and Tests Ordered: Current medicines are reviewed at length with the patient today.  Concerns regarding medicines are outlined above.  Orders Placed This Encounter  Procedures   EKG 12-Lead   ECHOCARDIOGRAM COMPLETE   No orders of the defined types were placed in this encounter.   Patient Instructions  Medication Instructions:   Your physician recommends that you continue on your current medications as directed. Please refer to the Current Medication list given to you today.  *If you need a refill on your cardiac medications before your next appointment, please call your pharmacy*   Lab Work:  None Ordered  If you have labs (blood work) drawn today and your tests are completely normal, you will receive your results only by: MyChart Message (if you have MyChart) OR A paper copy in the mail If you have any lab test that is abnormal or we need to change your treatment, we will call you to review the results.   Testing/Procedures:  Your physician has requested that you have an echocardiogram. Echocardiography is a painless test that uses sound waves to create images of your heart. It provides your doctor with information about the size and shape of your heart  and how well your heart's chambers and valves are working. This procedure takes approximately one hour. There are no restrictions for this procedure. Please do NOT wear cologne, perfume, aftershave, or lotions (deodorant is allowed). Please arrive 15 minutes prior to your appointment time.    Follow-Up: At [facility], you and your health needs are our priority.  As part of our continuing mission to provide you with exceptional heart care, we have created designated Provider Care Teams.  These Care Teams include your primary Cardiologist (physician) and Advanced Practice Providers (APPs -  Physician Assistants and Nurse Practitioners) who all work together to provide you with the care you need, when you need it.  We recommend signing up for the patient portal called "MyChart".  Sign up information is provided on this After Visit Summary.  MyChart is used to connect with patients for Virtual Visits (Telemedicine).  Patients are able to view lab/test results, encounter notes, upcoming appointments, etc.  Non-urgent messages can be sent to your provider as well.   To learn more about what you can do with MyChart, go to [email or web address].    Your next appointment:    After Cardiac testing  Provider:   You may see Debbe Odea, MD or one of the following Advanced Practice Providers on your designated Care Team:   Nicolasa Ducking, NP Eula Listen, PA-C Cadence Fransico Michael, PA-C Charlsie Quest, NP    Signed, Debbe Odea, MD  10/25/2022 9:39 AM    [facility]

## 2022-10-25 NOTE — Patient Instructions (Signed)
Medication Instructions:   Your physician recommends that you continue on your current medications as directed. Please refer to the Current Medication list given to you today.  *If you need a refill on your cardiac medications before your next appointment, please call your pharmacy*   Lab Work:  None Ordered  If you have labs (blood work) drawn today and your tests are completely normal, you will receive your results only by: MyChart Message (if you have MyChart) OR A paper copy in the mail If you have any lab test that is abnormal or we need to change your treatment, we will call you to review the results.   Testing/Procedures:  Your physician has requested that you have an echocardiogram. Echocardiography is a painless test that uses sound waves to create images of your heart. It provides your doctor with information about the size and shape of your heart and how well your heart's chambers and valves are working. This procedure takes approximately one hour. There are no restrictions for this procedure. Please do NOT wear cologne, perfume, aftershave, or lotions (deodorant is allowed). Please arrive 15 minutes prior to your appointment time.    Follow-Up: At [facility], you and your health needs are our priority.  As part of our continuing mission to provide you with exceptional heart care, we have created designated Provider Care Teams.  These Care Teams include your primary Cardiologist (physician) and Advanced Practice Providers (APPs -  Physician Assistants and Nurse Practitioners) who all work together to provide you with the care you need, when you need it.  We recommend signing up for the patient portal called "MyChart".  Sign up information is provided on this After Visit Summary.  MyChart is used to connect with patients for Virtual Visits (Telemedicine).  Patients are able to view lab/test results, encounter notes, upcoming appointments, etc.  Non-urgent messages can  be sent to your provider as well.   To learn more about what you can do with MyChart, go to [email or web address].    Your next appointment:    After Cardiac testing  Provider:   You may see Debbe Odea, MD or one of the following Advanced Practice Providers on your designated Care Team:   Nicolasa Ducking, NP Eula Listen, PA-C Cadence Fransico Michael, PA-C Charlsie Quest, NP

## 2022-11-16 ENCOUNTER — Ambulatory Visit: Payer: BC Managed Care – PPO | Attending: Cardiology

## 2022-11-16 ENCOUNTER — Other Ambulatory Visit: Payer: Self-pay | Admitting: Cardiology

## 2022-11-16 ENCOUNTER — Other Ambulatory Visit: Payer: BC Managed Care – PPO

## 2022-11-16 DIAGNOSIS — I2510 Atherosclerotic heart disease of native coronary artery without angina pectoris: Secondary | ICD-10-CM | POA: Diagnosis not present

## 2022-11-16 DIAGNOSIS — E7800 Pure hypercholesterolemia, unspecified: Secondary | ICD-10-CM

## 2022-11-16 LAB — ECHOCARDIOGRAM COMPLETE
Area-P 1/2: 3.21 cm2
S' Lateral: 3.6 cm

## 2022-11-19 DIAGNOSIS — E119 Type 2 diabetes mellitus without complications: Secondary | ICD-10-CM | POA: Diagnosis not present

## 2022-11-29 NOTE — Progress Notes (Signed)
Name: Jeffrey Schneider   MRN: [id number]    DOB: 13-Mar-1970   Date:11/30/2022       Progress Note  Subjective  Chief Complaint  Annual Exam  HPI  Patient presents for annual CPE.  IPSS Questionnaire (AUA-7): Over the past month.   1)  How often have you had a sensation of not emptying your bladder completely after you finish urinating?  1 - Less than 1 time in 5  2)  How often have you had to urinate again less than two hours after you finished urinating? 1 - Less than 1 time in 5  3)  How often have you found you stopped and started again several times when you urinated?  0 - Not at all  4) How difficult have you found it to postpone urination?  0 - Not at all  5) How often have you had a weak urinary stream?  0 - Not at all  6) How often have you had to push or strain to begin urination?  0 - Not at all  7) How many times did you most typically get up to urinate from the time you went to bed until the time you got up in the morning?  1 - 1 time  Total score:  0-7 mildly symptomatic   8-19 moderately symptomatic   20-35 severely symptomatic     Diet: balanced Exercise: continue regular physical activity  Last Dental Exam: November 2024-[facility] Last Eye Exam: February 2024- [facility]  Depression: phq 9 is positive    11/30/2022    8:34 AM 07/26/2022    7:53 AM 07/26/2022    7:47 AM 01/19/2022    8:24 AM 11/24/2021    8:55 AM  Depression screen PHQ 2/9  Decreased Interest 1 1 0 0 1  Down, Depressed, Hopeless 1 1 0 0 1  PHQ - 2 Score 2 2 0 0 2  Altered sleeping 0 0 0 1 0  Tired, decreased energy 0 1 0 1 0  Change in appetite 0 0 0 0 0  Feeling bad or failure about yourself  0 0 0 0 0  Trouble concentrating 0 0 0 0 0  Moving slowly or fidgety/restless 0 0 0 0 0  Suicidal thoughts 0 0 0 0 0  PHQ-9 Score 2 3 0 2 2  Difficult doing work/chores Not difficult at all Not difficult at all Not difficult at all      Hypertension:  BP Readings from Last 3 Encounters:  11/30/22  114/66  10/25/22 124/72  09/28/22 110/76    Obesity: Wt Readings from Last 3 Encounters:  11/30/22 247 lb 11.2 oz (112.4 kg)  10/25/22 251 lb 3.2 oz (113.9 kg)  07/26/22 252 lb 11.2 oz (114.6 kg)   BMI Readings from Last 3 Encounters:  11/30/22 31.59 kg/m  10/25/22 31.82 kg/m  07/26/22 32.44 kg/m     Lipids:  Lab Results  Component Value Date   CHOL 123 07/26/2022   CHOL 122 08/05/2021   CHOL 106 07/02/2020   Lab Results  Component Value Date   HDL 41 07/26/2022   HDL 44 08/05/2021   HDL 39 (L) 07/02/2020   Lab Results  Component Value Date   LDLCALC 65 07/26/2022   LDLCALC 65 08/05/2021   LDLCALC 53 07/02/2020   Lab Results  Component Value Date   TRIG 89 07/26/2022   TRIG 59 08/05/2021   TRIG 56 07/02/2020   Lab Results  Component  Value Date   CHOLHDL 3.0 07/26/2022   CHOLHDL 2.8 08/05/2021   CHOLHDL 2.7 07/02/2020   No results found for: "LDLDIRECT" Glucose:  Glucose  Date Value Ref Range Status  08/05/2021 108 (H) 70 - 99 mg/dL Final   Glucose, Bld  Date Value Ref Range Status  07/26/2022 112 (H) 65 - 99 mg/dL Final    Comment:    .            Fasting reference interval . For someone without known diabetes, a glucose value between 100 and 125 mg/dL is consistent with prediabetes and should be confirmed with a follow-up test. .   07/02/2020 92 65 - 99 mg/dL Final    Comment:    .            Fasting reference interval .   09/19/2019 84 65 - 99 mg/dL Final    Comment:    .            Fasting reference interval .     Flowsheet Row Office Visit from 11/30/2022 in [facility]  AUDIT-C Score 0      Married STD testing and prevention (HIV/chl/gon/syphilis): N/A Sexual history: one partner, wife  Hep C Screening: 06/21/18 Skin cancer: Discussed monitoring for atypical lesions Colorectal cancer: 03/11/14 repeat in 2026  Prostate cancer:   Lab Results  Component Value Date   PSA Normal, 0.5 02/21/2014      Lung cancer:  Low Dose CT Chest recommended if Age 26-80 years, 30 pack-year currently smoking OR have quit w/in 15years. Patient is not a candidate for screening   AAA: The USPSTF recommends one-time screening with ultrasonography in men ages 28 to 75 years who have ever smoked. Patient is not  a candidate for screening  ECG:  10/25/22  Vaccines:   Tdap: up to date Shingrix: he will check coverage with insurance  Pneumonia: up to date  Flu: up to date COVID-19: up to date  Advanced Care Planning: A voluntary discussion about advance care planning including the explanation and discussion of advance directives.  Discussed health care proxy and Living will, and the patient was able to identify a health care proxy as wife .  Patient does not have a living will and power of attorney of health care   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   GERD without esophagitis 07/17/2021   Dysthymia 07/17/2021   Dyslipidemia associated with type 2 diabetes mellitus (HCC) 05/26/2019   Medial meniscus tear 11/08/2017   Lateral meniscus tear 01/30/2016   Bilateral knee pain 09/27/2014   Dyslipidemia 09/24/2014   Hemorrhoids, internal 09/24/2014   Dysmetabolic syndrome 09/24/2014   Obesity (BMI 30.0-34.9) 09/24/2014   Osteoarthritis of left knee 09/24/2014    Past Surgical History:  Procedure Laterality Date   ACHILLES TENDON SURGERY Left    Past Rupture    Family History  Problem Relation Age of Onset   Obesity Mother    Schizophrenia Mother    Atrial fibrillation Mother    Diabetes Mother    Alcohol abuse Father    Cirrhosis Father    Cirrhosis Sister    Alcohol abuse Brother    Allergic rhinitis Daughter    Allergic rhinitis Son    Cancer Maternal Grandmother        Colon    Social History   Socioeconomic History  Marital status: Married    Spouse name: Jeffrey Schneider    Number of children: 2   Years of education: Not on file   Highest education level: Master's degree (e.g., MA, MS, MEng, MEd, MSW, MBA)  Occupational History   Occupation: Associate Professor: [employer]  Tobacco Use   Smoking status: Never   Smokeless tobacco: Never  Vaping Use   Vaping status: Never Used  Substance and Sexual Activity   Alcohol use: No    Alcohol/week: 0.0 standard drinks of alcohol   Drug use: No   Sexual activity: Yes    Partners: Female  Other Topics Concern   Not on file  Social History Narrative   Married   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   Two children, still at home ( teenagers )    Social Determinants of Health   Financial Resource Strain: Low Risk  (11/30/2022)   Overall Financial Resource Strain (CARDIA)    Difficulty of Paying Living Expenses: Not hard at all  Food Insecurity: No Food Insecurity (11/30/2022)   Hunger Vital Sign    Worried About Running Out of Food in the Last Year: Never true    Ran Out of Food in the Last Year: Never true  Transportation Needs: No Transportation Needs (11/30/2022)   PRAPARE - Administrator, Civil Service (Medical): No    Lack of Transportation (Non-Medical): No  Physical Activity: Insufficiently Active (11/30/2022)   Exercise Vital Sign    Days of Exercise per Week: 4 days    Minutes of Exercise per Session: 30 min  Stress: Stress Concern Present (11/30/2022)   [employer] of Occupational Health - Occupational Stress Questionnaire    Feeling of Stress : To some extent  Social Connections: Moderately Isolated (11/30/2022)   Social Connection and Isolation Panel [NHANES]    Frequency of Communication with Friends and Family: Once a week    Frequency of Social Gatherings with Friends and Family: Once a week    Attends Religious Services: 1 to 4 times per year    Active Member of [employer] or Organizations: No    Attends Banker Meetings: Never    Marital  Status: Married  Catering manager Violence: Unknown (11/30/2022)   Humiliation, Afraid, Rape, and Kick questionnaire    Fear of Current or Ex-Partner: Not on file    Emotionally Abused: No    Physically Abused: No    Sexually Abused: No     Current Outpatient Medications:    aspirin EC 81 MG tablet, Take 81 mg by mouth daily. Swallow whole., Disp: , Rfl:    rosuvastatin (CRESTOR) 5 MG tablet, TAKE 1 TABLET (5 MG TOTAL) BY MOUTH DAILY., Disp: 90 tablet, Rfl: 0  No Known Allergies   ROS  Constitutional: Negative for fever or significant weight change.  Respiratory: Negative for cough and shortness of breath.   Cardiovascular: Negative for chest pain or palpitations.  Gastrointestinal: Negative for abdominal pain, no bowel changes.  Musculoskeletal: Negative for gait problem or joint swelling.  Skin: Negative for rash.  Neurological: Negative for dizziness or headache.  No other specific complaints in a complete review of systems (except as listed in HPI above).    Objective  Vitals:   11/30/22 0833  BP: 114/66  Pulse: 66  Resp: 16  Temp: 97.9 F (36.6 C)  TempSrc: Oral  SpO2: 98%  Weight: 247 lb 11.2 oz (112.4 kg)  Height: 6' 2.25" (1.886 m)  Body mass index is 31.59 kg/m.  Physical Exam  Constitutional: Patient appears well-developed and well-nourished. No distress.  HENT: Head: Normocephalic and atraumatic. Ears: B TMs ok, no erythema or effusion; Nose: Nose normal. Mouth/Throat: Oropharynx is clear and moist. No oropharyngeal exudate.  Eyes: Conjunctivae and EOM are normal. Pupils are equal, round, and reactive to light. No scleral icterus.  Neck: Normal range of motion. Neck supple. No JVD present. No thyromegaly present.  Cardiovascular: Normal rate, regular rhythm and normal heart sounds.  No murmur heard. No BLE edema. Pulmonary/Chest: Effort normal and breath sounds normal. No respiratory distress. Abdominal: Soft. Bowel sounds are normal, no distension.  There is no tenderness. no masses MALE GENITALIA: Normal descended testes bilaterally, no masses palpated, no hernias, no lesions, no discharge RECTAL:not done  Musculoskeletal: Normal range of motion, no joint effusions. No gross deformities Neurological: he is alert and oriented to person, place, and time. No cranial nerve deficit. Coordination, balance, strength, speech and gait are normal.  Skin: Skin is warm and dry. No rash noted. No erythema.  Psychiatric: Patient has a normal mood and affect. behavior is normal. Judgment and thought content normal.   Recent Results (from the past 2160 hour(s))  ECHOCARDIOGRAM COMPLETE     Status: None   Collection Time: 11/16/22  3:38 PM  Result Value Ref Range   S' Lateral 3.60 cm   Area-P 1/2 3.21 cm2   Est EF 55 - 60%      Fall Risk:    11/30/2022    8:40 AM 07/26/2022    7:53 AM 07/26/2022    7:47 AM 01/19/2022    8:24 AM 11/24/2021    8:55 AM  Fall Risk   Falls in the past year? 0 0 0 0 0  Number falls in past yr:  0 0 0 0  Injury with Fall?  0 0 0 0  Risk for fall due to : No Fall Risks  No Fall Risks No Fall Risks No Fall Risks  Follow up Falls prevention discussed;Education provided;Falls evaluation completed  Falls prevention discussed;Education provided;Falls evaluation completed Falls prevention discussed Falls prevention discussed     Functional Status Survey: Is the patient deaf or have difficulty hearing?: No Does the patient have difficulty seeing, even when wearing glasses/contacts?: No Does the patient have difficulty concentrating, remembering, or making decisions?: No Does the patient have difficulty walking or climbing stairs?: Yes Does the patient have difficulty dressing or bathing?: No Does the patient have difficulty doing errands alone such as visiting a doctor's office or shopping?: No    Assessment & Plan  1. Well adult exam    -Prostate cancer screening and PSA options (with potential risks and benefits  of testing vs not testing) were discussed along with recent recs/guidelines. -USPSTF grade A and B recommendations reviewed with patient; age-appropriate recommendations, preventive care, screening tests, etc discussed and encouraged; healthy living encouraged; see AVS for patient education given to patient -Discussed importance of 150 minutes of physical activity weekly, eat two servings of fish weekly, eat one serving of tree nuts ( cashews, pistachios, pecans, almonds.Marland Kitchen) every other day, eat 6 servings of fruit/vegetables daily and drink plenty of water and avoid sweet beverages.  -Reviewed Health Maintenance: yes

## 2022-11-30 ENCOUNTER — Encounter: Payer: Self-pay | Admitting: Family Medicine

## 2022-11-30 ENCOUNTER — Ambulatory Visit (INDEPENDENT_AMBULATORY_CARE_PROVIDER_SITE_OTHER): Payer: BC Managed Care – PPO | Admitting: Family Medicine

## 2022-11-30 VITALS — BP 114/66 | HR 66 | Temp 97.9°F | Resp 16 | Ht 74.25 in | Wt 247.7 lb

## 2022-11-30 DIAGNOSIS — Z0000 Encounter for general adult medical examination without abnormal findings: Secondary | ICD-10-CM | POA: Diagnosis not present

## 2022-12-01 ENCOUNTER — Encounter: Payer: Self-pay | Admitting: Cardiology

## 2022-12-01 ENCOUNTER — Ambulatory Visit: Payer: BC Managed Care – PPO | Attending: Cardiology | Admitting: Cardiology

## 2022-12-01 VITALS — BP 122/70 | HR 64 | Ht 74.25 in | Wt 252.4 lb

## 2022-12-01 DIAGNOSIS — E7800 Pure hypercholesterolemia, unspecified: Secondary | ICD-10-CM

## 2022-12-01 DIAGNOSIS — I2510 Atherosclerotic heart disease of native coronary artery without angina pectoris: Secondary | ICD-10-CM | POA: Diagnosis not present

## 2022-12-01 NOTE — Progress Notes (Signed)
Cardiology Office Note:  .   Date:  12/01/2022  ID:  Jeffrey Schneider, DOB [birth year], MRN [id number] PCP: Alba Cory, MD   HeartCare Providers Cardiologist:  Debbe Odea, MD    History of Present Illness: .   Jeffrey Schneider is a 52 y.o. male with a past medical history of coronary artery calcification, pure hypercholesterolemia, GERD, type 2 diabetes, obesity, who is here today for follow-up.  He was last seen in clinic 10/25/2022 by Dr.Agbor-Etang at that time he was referred for cardiac evaluation of a coronary artery calcium score with no personal cardiac history.  Previous coronary calcium scan completed 08/04/2022 with a calcium score of 12.2/73rd percentile noted.  He continues to deny any chest pain or shortness of breath.  He had remained very active.  Denied any family history of heart disease stated that he had been compliant with his rosuvastatin prescribed for his hyperlipidemia without any adverse side effects.  LDL has been adequately controlled.  He was continued on aspirin.  Scheduled for an echocardiogram to rule out any structural abnormalities and no further medication changes were made.  Echocardiogram revealed LVEF 55 to 60%, no RWMA, and no valvular abnormalities were noted.  There was mild dilatation of the aortic root measuring 44 mm.  He returns to clinic today he has been doing well from a cardiac perspective.  He denies any chest pain, shortness of breath, palpitations, lightheadedness or dizziness.  He has been compliant with his current medication regimen without adverse side effects.  Continues to be active playing basketball.  Denies any hospitalizations or visits to the emergency department.  ROS: 10 point review of systems has been reviewed and considered negative with exception was been listed in HPI  Studies Reviewed: .       2D echo 11/16/22 1. Left ventricular ejection fraction, by estimation, is 55 to 60%. The  left ventricle has  normal function. The left ventricle has no regional  wall motion abnormalities. Left ventricular diastolic parameters were  normal. The average left ventricular  global longitudinal strain is -19.4 %.   2. Right ventricular systolic function is normal. The right ventricular  size is normal. Tricuspid regurgitation signal is inadequate for assessing  PA pressure.   3. The mitral valve is normal in structure. No evidence of mitral valve  regurgitation. No evidence of mitral stenosis.   4. The aortic valve is tricuspid. Aortic valve regurgitation is not  visualized. No aortic stenosis is present.   5. There is mild dilatation of the aortic root, measuring 44 mm.   6. The inferior vena cava is normal in size with greater than 50%  respiratory variability, suggesting right atrial pressure of 3 mmHg.  Risk Assessment/Calculations:             Physical Exam:   VS:  BP 122/70 (BP Location: Left Arm, Patient Position: Sitting, Cuff Size: Large)   Pulse 64   Ht 6' 2.25" (1.886 m)   Wt 252 lb 6.4 oz (114.5 kg)   SpO2 99%   BMI 32.19 kg/m    Wt Readings from Last 3 Encounters:  12/01/22 252 lb 6.4 oz (114.5 kg)  11/30/22 247 lb 11.2 oz (112.4 kg)  10/25/22 251 lb 3.2 oz (113.9 kg)    GEN: Well nourished, well developed in no acute distress NECK: No JVD; No carotid bruits CARDIAC: RRR, no murmurs, rubs, gallops RESPIRATORY:  Clear to auscultation without rales, wheezing or rhonchi  ABDOMEN: Soft, non-tender, non-distended  EXTREMITIES:  No edema; No deformity   ASSESSMENT AND PLAN: .   Coronary calcification with calcium score 12.2.  Denies any anginal or anginal equivalents.  Echocardiogram completed which revealed an LVEF of 55 to 60%, no RWMA, and no valvular abnormalities with only mild dilatation of the aortic root measuring 44 mm.  Without any structural abnormalities or symptoms he will continue with surveillance echocardiograms for reevaluation of aortic root on a yearly basis.  He  is also continued on aspirin 81 mg daily and statin therapy.  Discussion on primary prevention of coronary artery disease with exercise, diet, and medications.  Hyperlipidemia with cholesterol that has been well-controlled.  He is continued on rosuvastatin 5 mg daily.       Dispo: Patient to return to clinic to see MD/APP in 1 year or sooner if needed for reevaluation of symptoms.  Signed, Allani Reber, NP

## 2022-12-01 NOTE — Patient Instructions (Signed)
Medication Instructions:  Your physician recommends that you continue on your current medications as directed. Please refer to the Current Medication list given to you today.  *If you need a refill on your cardiac medications before your next appointment, please call your pharmacy*  Lab Work: - None ordered  Testing/Procedures: - None ordered  Follow-Up: At [facility], you and your health needs are our priority.  As part of our continuing mission to provide you with exceptional heart care, we have created designated Provider Care Teams.  These Care Teams include your primary Cardiologist (physician) and Advanced Practice Providers (APPs -  Physician Assistants and Nurse Practitioners) who all work together to provide you with the care you need, when you need it.  Your next appointment:   11 - 12 month(s)  Provider:   You may see Debbe Odea, MD or one of the following Advanced Practice Providers on your designated Care Team:   Nicolasa Ducking, NP Eula Listen, PA-C Cadence Fransico Michael, PA-C Charlsie Quest, NP Carlos Levering, NP    Other Instructions - None

## 2022-12-06 NOTE — Progress Notes (Deleted)
This encounter was created in error - please disregard.

## 2022-12-17 ENCOUNTER — Other Ambulatory Visit: Payer: Self-pay | Admitting: Family Medicine

## 2022-12-17 DIAGNOSIS — E669 Obesity, unspecified: Secondary | ICD-10-CM

## 2022-12-17 DIAGNOSIS — E1169 Type 2 diabetes mellitus with other specified complication: Secondary | ICD-10-CM

## 2022-12-19 DIAGNOSIS — E119 Type 2 diabetes mellitus without complications: Secondary | ICD-10-CM | POA: Diagnosis not present

## 2022-12-27 NOTE — Progress Notes (Signed)
Name: RUSHTON DIVAN   MRN: [id number]    DOB: 01/20/1970   Date:01/13/2023       Progress Note  Subjective  Chief Complaint  Chief Complaint  Patient presents with   Medical Management of Chronic Issues    HPI  Discussed the use of AI scribe software for clinical note transcription with the patient, who gave verbal consent to proceed.  History of Present Illness   The patient, with a history of osteoarthritis, GERD, and a high calcium score, presents for a follow-up visit. He reports intermittent episodes of lightheadedness, which seem to be triggered by certain head movements. He also describes brief, sharp chest pain in the left upper quadrant, which he suspects may be related to his known GERD. Additionally, he mentions occasional cramping in the left side of his abdomen.  The patient also expresses concern about forgetfulness, particularly with names, but denies any family history of dementia. He has been managing his osteoarthritis with over-the-counter pain medications and continues to play basketball, albeit less frequently due to increasing knee pain.  The patient's A1c has slightly increased from 6.1 to 6.3, but he is hesitant about starting medication. He is currently taking rosuvastatin for cholesterol management, which has been effective in maintaining his cholesterol levels.  The patient's emotional health appears stable, with a positive outlook on his children's academic progress and a plan to continue his current employment for the next few years to support his children's education.         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   GERD without esophagitis 07/17/2021   Dysthymia 07/17/2021   Dyslipidemia associated with type 2 diabetes mellitus (HCC) 05/26/2019   Medial meniscus tear 11/08/2017   Lateral meniscus tear 01/30/2016   Bilateral knee pain  09/27/2014   Dyslipidemia 09/24/2014   Hemorrhoids, internal 09/24/2014   Dysmetabolic syndrome 09/24/2014   Obesity (BMI 30.0-34.9) 09/24/2014   Osteoarthritis of left knee 09/24/2014    Past Surgical History:  Procedure Laterality Date   ACHILLES TENDON SURGERY Left    Past Rupture    Family History  Problem Relation Age of Onset   Obesity Mother    Schizophrenia Mother    Atrial fibrillation Mother    Diabetes Mother    Alcohol abuse Father    Cirrhosis Father    Cirrhosis Sister    Alcohol abuse Brother    Allergic rhinitis Daughter    Allergic rhinitis Son    Cancer Maternal Grandmother        Colon    Social History   Tobacco Use   Smoking status: Never   Smokeless tobacco: Never  Substance Use Topics   Alcohol use: No    Alcohol/week: 0.0 standard drinks of alcohol     Current Outpatient Medications:    aspirin EC 81 MG tablet, Take 81 mg by mouth daily. Swallow whole., Disp: , Rfl:    rosuvastatin (CRESTOR) 5 MG tablet, TAKE 1 TABLET (5 MG TOTAL) BY MOUTH DAILY., Disp: 30 tablet, Rfl: 0  No Known Allergies  I personally reviewed active problem list, medication list, allergies, family history with the patient/caregiver today.   ROS  Ten systems reviewed and is negative except as mentioned in HPI    Objective  Vitals:   01/13/23 0808  BP: 120/78  Pulse: 65  Resp: 16  Temp: 97.9 F (36.6 C)  TempSrc:  Oral  SpO2: 98%  Weight: 255 lb 8 oz (115.9 kg)  Height: 6' 2.25" (1.886 m)    Body mass index is 32.58 kg/m.  Physical Exam  Constitutional: Patient appears well-developed and well-nourished. Obese  No distress.  HEENT: head atraumatic, normocephalic, pupils equal and reactive to light, neck supple Cardiovascular: Normal rate, regular rhythm and normal heart sounds.  No murmur heard. No BLE edema. Pulmonary/Chest: Effort normal and breath sounds normal. No respiratory distress. Abdominal: Soft.  There is no tenderness. Psychiatric:  Patient has a normal mood and affect. behavior is normal. Judgment and thought content normal.   Recent Results (from the past 2160 hours)  ECHOCARDIOGRAM COMPLETE     Status: None   Collection Time: 11/16/22  3:38 PM  Result Value Ref Range   S' Lateral 3.60 cm   Area-P 1/2 3.21 cm2   Est EF 55 - 60%   POCT HgB A1C     Status: Abnormal   Collection Time: 01/13/23  8:12 AM  Result Value Ref Range   Hemoglobin A1C 6.3 (A) 4.0 - 5.6 %   HbA1c POC (<> result, manual entry)     HbA1c, POC (prediabetic range)     HbA1c, POC (controlled diabetic range)        PHQ2/9:    01/13/2023    8:07 AM 11/30/2022    8:34 AM 07/26/2022    7:53 AM 07/26/2022    7:47 AM 01/19/2022    8:24 AM  Depression screen PHQ 2/9  Decreased Interest 0 1 1 0 0  Down, Depressed, Hopeless 0 1 1 0 0  PHQ - 2 Score 0 2 2 0 0  Altered sleeping 0 0 0 0 1  Tired, decreased energy 0 0 1 0 1  Change in appetite 0 0 0 0 0  Feeling bad or failure about yourself  0 0 0 0 0  Trouble concentrating 0 0 0 0 0  Moving slowly or fidgety/restless 0 0 0 0 0  Suicidal thoughts 0 0 0 0 0  PHQ-9 Score 0 2 3 0 2  Difficult doing work/chores Not difficult at all Not difficult at all Not difficult at all Not difficult at all     phq 9 is negative   Fall Risk:    11/30/2022    8:40 AM 07/26/2022    7:53 AM 07/26/2022    7:47 AM 01/19/2022    8:24 AM 11/24/2021    8:55 AM  Fall Risk   Falls in the past year? 0 0 0 0 0  Number falls in past yr:  0 0 0 0  Injury with Fall?  0 0 0 0  Risk for fall due to : No Fall Risks  No Fall Risks No Fall Risks No Fall Risks  Follow up Falls prevention discussed;Education provided;Falls evaluation completed  Falls prevention discussed;Education provided;Falls evaluation completed Falls prevention discussed Falls prevention discussed    Assessment & Plan  Assessment and Plan    Episodic Vertigo Brief episodes of dizziness associated with head movement. Likely benign paroxysmal positional  vertigo (BPPV). -Provided patient with information on Epley maneuver for home management.  Chest Pain Brief, sharp, left-sided chest pain. Likely gastrointestinal in origin, possibly related to gastroesophageal reflux disease (GERD). -Advise over-the-counter antacids or H2 blockers as needed. -Encourage return to regular diet post-holidays. -Discussed symptoms of heart attack with patient   Osteoarthritis of the Knees Experiencing increased pain, affecting physical activity. -Encourage regular stretching and low-impact exercises such  as elliptical or cycling. -Continue over-the-counter pain management with Tylenol and Advil as needed.  Type 2 Diabetes Mellitus with dyslipidemia A1c increased from 6.1 to 6.3, likely due to holiday season dietary changes. -Encourage return to regular diet and exercise routine. -Consider medication if A1c continues to trend upwards.  Hyperlipidemia Well-controlled on rosuvastatin 5mg . -Continue rosuvastatin 5mg , refill prescription for 6 months.  General Health Maintenance / Followup Plans -Consider pneumococcal vaccine in 2025. -Advise patient to obtain shingles vaccine at local pharmacy. -Follow-up appointment in 6 months.

## 2023-01-10 ENCOUNTER — Other Ambulatory Visit: Payer: Self-pay | Admitting: Family Medicine

## 2023-01-10 DIAGNOSIS — E669 Obesity, unspecified: Secondary | ICD-10-CM

## 2023-01-10 DIAGNOSIS — E1169 Type 2 diabetes mellitus with other specified complication: Secondary | ICD-10-CM

## 2023-01-13 ENCOUNTER — Encounter: Payer: Self-pay | Admitting: Family Medicine

## 2023-01-13 ENCOUNTER — Ambulatory Visit: Payer: BC Managed Care – PPO | Admitting: Family Medicine

## 2023-01-13 VITALS — BP 120/78 | HR 65 | Temp 97.9°F | Resp 16 | Ht 74.25 in | Wt 255.5 lb

## 2023-01-13 DIAGNOSIS — M170 Bilateral primary osteoarthritis of knee: Secondary | ICD-10-CM | POA: Diagnosis not present

## 2023-01-13 DIAGNOSIS — E785 Hyperlipidemia, unspecified: Secondary | ICD-10-CM

## 2023-01-13 DIAGNOSIS — K219 Gastro-esophageal reflux disease without esophagitis: Secondary | ICD-10-CM

## 2023-01-13 DIAGNOSIS — Z23 Encounter for immunization: Secondary | ICD-10-CM

## 2023-01-13 DIAGNOSIS — R42 Dizziness and giddiness: Secondary | ICD-10-CM

## 2023-01-13 DIAGNOSIS — E1169 Type 2 diabetes mellitus with other specified complication: Secondary | ICD-10-CM | POA: Diagnosis not present

## 2023-01-13 DIAGNOSIS — F341 Dysthymic disorder: Secondary | ICD-10-CM

## 2023-01-13 DIAGNOSIS — R931 Abnormal findings on diagnostic imaging of heart and coronary circulation: Secondary | ICD-10-CM

## 2023-01-13 LAB — POCT GLYCOSYLATED HEMOGLOBIN (HGB A1C): Hemoglobin A1C: 6.3 % — AB (ref 4.0–5.6)

## 2023-01-13 MED ORDER — ROSUVASTATIN CALCIUM 5 MG PO TABS
5.0000 mg | ORAL_TABLET | Freq: Every day | ORAL | 1 refills | Status: DC
Start: 2023-01-13 — End: 2023-07-20

## 2023-01-13 NOTE — Patient Instructions (Signed)
How to Perform the Epley Maneuver The Epley maneuver is an exercise that relieves symptoms of vertigo. Vertigo is the feeling that you or your surroundings are moving when they are not. When you feel vertigo, you may feel like the room is spinning and may have trouble walking. The Epley maneuver is used for a type of vertigo caused by a calcium deposit in a part of the inner ear. The maneuver involves changing head positions to help the deposit move out of the area. You can do this maneuver at home whenever you have symptoms of vertigo. You can repeat it in 24 hours if your vertigo has not gone away. Even though the Epley maneuver may relieve your vertigo for a few weeks, it is possible that your symptoms will return. This maneuver relieves vertigo, but it does not relieve dizziness. What are the risks? If it is done correctly, the Epley maneuver is considered safe. Sometimes it can lead to dizziness or nausea that goes away after a short time. If you develop other symptoms--such as changes in vision, weakness, or numbness--stop doing the maneuver and call your health care provider. Supplies needed: A bed or table. A pillow. How to do the Epley maneuver     Sit on the edge of a bed or table with your back straight and your legs extended or hanging over the edge of the bed or table. Turn your head halfway toward the affected ear or side as told by your health care provider. Lie backward quickly with your head turned until you are lying flat on your back. Your head should dangle (head-hanging position). You may want to position a pillow under your shoulders. Hold this position for at least 30 seconds. If you feel dizzy or have symptoms of vertigo, continue to hold the position until the symptoms stop. Turn your head to the opposite direction until your unaffected ear is facing down. Your head should continue to dangle. Hold this position for at least 30 seconds. If you feel dizzy or have symptoms of  vertigo, continue to hold the position until the symptoms stop. Turn your whole body to the same side as your head so that you are positioned on your side. Your head will now be nearly facedown and no longer needs to dangle. Hold for at least 30 seconds. If you feel dizzy or have symptoms of vertigo, continue to hold the position until the symptoms stop. Sit back up. You can repeat the maneuver in 24 hours if your vertigo does not go away. Follow these instructions at home: For 24 hours after doing the Epley maneuver: Keep your head in an upright position. When lying down to sleep or rest, keep your head raised (elevated) with two or more pillows. Avoid excessive neck movements. Activity Do not drive or use machinery if you feel dizzy. After doing the Epley maneuver, return to your normal activities as told by your health care provider. Ask your health care provider what activities are safe for you. General instructions Drink enough fluid to keep your urine pale yellow. Do not drink alcohol. Take over-the-counter and prescription medicines only as told by your health care provider. Keep all follow-up visits. This is important. Preventing vertigo symptoms Ask your health care provider if there is anything you should do at home to prevent vertigo. He or she may recommend that you: Keep your head elevated with two or more pillows while you sleep. Do not sleep on the side of your affected ear. Get   up slowly from bed. Avoid sudden movements during the day. Avoid extreme head positions or movement, such as looking up or bending over. Contact a health care provider if: Your vertigo gets worse. You have other symptoms, including: Nausea. Vomiting. Headache. Get help right away if you: Have vision changes. Have a headache or neck pain that is severe or getting worse. Cannot stop vomiting. Have new numbness or weakness in any part of your body. These symptoms may represent a serious problem  that is an emergency. Do not wait to see if the symptoms will go away. Get medical help right away. Call your local emergency services (911 in the [place] Do not drive yourself to the hospital. Summary Vertigo is the feeling that you or your surroundings are moving when they are not. The Epley maneuver is an exercise that relieves symptoms of vertigo. If the Epley maneuver is done correctly, it is considered safe. This information is not intended to replace advice given to you by your health care provider. Make sure you discuss any questions you have with your health care provider. Document Revised: 12/05/2019 Document Reviewed: 12/05/2019 Elsevier Patient Education  2024 Elsevier Inc[employer]  

## 2023-01-19 DIAGNOSIS — E119 Type 2 diabetes mellitus without complications: Secondary | ICD-10-CM | POA: Diagnosis not present

## 2023-02-19 DIAGNOSIS — E119 Type 2 diabetes mellitus without complications: Secondary | ICD-10-CM | POA: Diagnosis not present

## 2023-03-17 LAB — OPHTHALMOLOGY REPORT-SCANNED

## 2023-03-19 DIAGNOSIS — E119 Type 2 diabetes mellitus without complications: Secondary | ICD-10-CM | POA: Diagnosis not present

## 2023-04-15 ENCOUNTER — Encounter: Payer: Self-pay | Admitting: Physician Assistant

## 2023-04-15 ENCOUNTER — Other Ambulatory Visit: Payer: Self-pay

## 2023-04-15 ENCOUNTER — Ambulatory Visit (INDEPENDENT_AMBULATORY_CARE_PROVIDER_SITE_OTHER): Payer: Self-pay | Admitting: Physician Assistant

## 2023-04-15 VITALS — BP 124/74 | HR 73 | Temp 97.5°F | Ht 74.25 in | Wt 250.0 lb

## 2023-04-15 DIAGNOSIS — J069 Acute upper respiratory infection, unspecified: Secondary | ICD-10-CM

## 2023-04-15 NOTE — Progress Notes (Signed)
 [facility] [address] [place], [place] [address]   Office Visit Note  Patient Name: Jeffrey Schneider Date of Birth [id number]  Medical Record number [id number]  Date of Service: 04/15/2023  Chief Complaint  Patient presents with   Sick visit    Patient reports productive cough, loss of appetite, headache, and body aches. Symptoms began 6-7 days ago. He spent 2 days in bed during that time period. He is feeling better now but states the cough is still lingering. He has been taking Mucinex and Dayquil.     HPI Pt is here for a sick visit. States he got sick about a week ago, things seem to be getting better but wants to get checked out. Cut grass on Saturday, Sunday started having cough, Monday had chills and feeling worse so went home from work early. Spent 2 days sleeping/in bed. Lack of appetite. +Congestion. Hoarse voice, HA, BA. Cough occasionally productive of sputum.  Overall seems to be improving, but cough lingering a little. Some rhinorrhea.  Used dayquil, nyquil, and mucinex, with some improvement.  No known aggravating factors.  No fevers, ear symptoms, CP, SOB, wheezing, abd pain, n/v/d/c, no other symptoms.    ROS: Review of Systems  Constitutional:  Positive for appetite change and fatigue. Negative for chills and fever.  HENT:  Positive for congestion, rhinorrhea and voice change. Negative for ear discharge and ear pain.   Respiratory:  Positive for cough. Negative for shortness of breath and wheezing.   Cardiovascular:  Negative for chest pain.  Gastrointestinal:  Negative for abdominal pain, constipation, diarrhea, nausea and vomiting.  Musculoskeletal:  Positive for myalgias.  Skin:  Negative for color change.  Allergic/Immunologic: Positive for immunocompromised state.  Neurological:  Positive for headaches. Negative for weakness and numbness.     Current Medication:  Outpatient Encounter Medications as of 04/15/2023  Medication Sig   aspirin EC  81 MG tablet Take 81 mg by mouth daily. Swallow whole.   rosuvastatin (CRESTOR) 5 MG tablet Take 1 tablet (5 mg total) by mouth daily.   No facility-administered encounter medications on file as of 04/15/2023.      Medical History: Past Medical History:  Diagnosis Date   Abnormal glucose    Cervicalgia    Decreased libido    Diabetes mellitus without complication (HCC)    Erectile dysfunction    Hyperlipidemia    Hypogonadism, male    Internal hemorrhoids    Left knee pain    Metabolic syndrome    Obesity    Thyroid enlarged      Vital Signs: BP 124/74   Pulse 73   Temp (!) 97.5 F (36.4 C)   Ht 6' 2.25" (1.886 m)   Wt 113.4 kg   SpO2 99%   BMI 31.88 kg/m    Physical Exam Vitals and nursing note reviewed.  Constitutional:      General: He is not in acute distress.    Appearance: Normal appearance. He is well-developed. He is not toxic-appearing.     Comments: Afebrile, nontoxic, NAD  HENT:     Head: Normocephalic and atraumatic.     Nose: Congestion and rhinorrhea present.     Mouth/Throat:     Mouth: Mucous membranes are moist.     Pharynx: Oropharynx is clear. Uvula midline. No pharyngeal swelling, oropharyngeal exudate, posterior oropharyngeal erythema or uvula swelling.  Eyes:     General:        Right eye: No discharge.  Left eye: No discharge.     Conjunctiva/sclera: Conjunctivae normal.  Cardiovascular:     Rate and Rhythm: Normal rate and regular rhythm.     Pulses: Normal pulses.     Heart sounds: Normal heart sounds, S1 normal and S2 normal. No murmur heard.    No friction rub. No gallop.  Pulmonary:     Effort: Pulmonary effort is normal. No respiratory distress.     Breath sounds: Normal breath sounds. No decreased breath sounds, wheezing, rhonchi or rales.     Comments: CTAB in all lung fields, no w/r/r, no hypoxia or increased WOB, speaking in full sentences, SpO2 99% on RA  Abdominal:     General: Bowel sounds are normal. There is  no distension.     Palpations: Abdomen is soft. Abdomen is not rigid.     Tenderness: There is no abdominal tenderness. There is no right CVA tenderness, left CVA tenderness, guarding or rebound.  Musculoskeletal:        General: Normal range of motion.     Cervical back: Normal range of motion and neck supple.  Skin:    General: Skin is warm and dry.     Findings: No rash.  Neurological:     Mental Status: He is alert and oriented to person, place, and time.     Sensory: Sensation is intact. No sensory deficit.     Motor: Motor function is intact.  Psychiatric:        Mood and Affect: Mood and affect normal.        Behavior: Behavior normal.       Assessment/Plan:   ICD-10-CM   1. URI with cough and congestion  J06.9        -Pt here with URI symptoms x1 wk, gradually improving, exam reveals pt is afebrile with a clear lung exam, mild nasal congestion and drainage, throat benign.  -Given improvement and timeframe, Doubt need for testing at this time.  -Suspect likely viral URI/infection.  -Discussed OTCs for symptomatic relief and supportive care (i.e. Alternating Tylenol 1000mg  max and Ibuprofen 600-800mg  max, Chloraseptic spray/throat lozenges for sore throat, hot tea with honey/lemon for sore throat, Flonase 2 sprays each nostril twice daily and Netipot for congestion, antihistamines like Zyrtec/Claritin/Xyzal/etc, decongestants like Sudafed for congestion with caution, Mucinex D for cough/congestion or Mucinex DM [dextromethorphan] for cough/expectoration, adequate hydration and rest, etc).  -F/up as needed or within next week or so if not improving, at that time more testing may be considered vs empiric abx decision may be made if indicated.  -Strict return/ED guidelines discussed.   General Counseling: Alver verbalizes understanding of the findings of todays visit and agrees with plan of treatment. I have discussed any further diagnostic evaluation that may be needed or  ordered today. We also reviewed his medications today. he has been encouraged to call the office with any questions or concerns that should arise related to todays visit.   No orders of the defined types were placed in this encounter.  No results found for this or any previous visit (from the past 24 hours).   No orders of the defined types were placed in this encounter.   Time spent: [address] Development worker, international aid

## 2023-04-19 DIAGNOSIS — E119 Type 2 diabetes mellitus without complications: Secondary | ICD-10-CM | POA: Diagnosis not present

## 2023-05-19 DIAGNOSIS — E119 Type 2 diabetes mellitus without complications: Secondary | ICD-10-CM | POA: Diagnosis not present

## 2023-06-19 DIAGNOSIS — E119 Type 2 diabetes mellitus without complications: Secondary | ICD-10-CM | POA: Diagnosis not present

## 2023-07-19 DIAGNOSIS — E119 Type 2 diabetes mellitus without complications: Secondary | ICD-10-CM | POA: Diagnosis not present

## 2023-07-20 ENCOUNTER — Other Ambulatory Visit: Payer: Self-pay | Admitting: Family Medicine

## 2023-07-20 DIAGNOSIS — E1169 Type 2 diabetes mellitus with other specified complication: Secondary | ICD-10-CM

## 2023-07-25 ENCOUNTER — Encounter: Payer: Self-pay | Admitting: Family Medicine

## 2023-07-25 ENCOUNTER — Ambulatory Visit: Payer: Self-pay | Admitting: Family Medicine

## 2023-07-25 VITALS — BP 114/74 | HR 97 | Resp 16 | Ht 74.25 in | Wt 253.3 lb

## 2023-07-25 DIAGNOSIS — E1169 Type 2 diabetes mellitus with other specified complication: Secondary | ICD-10-CM

## 2023-07-25 DIAGNOSIS — E785 Hyperlipidemia, unspecified: Secondary | ICD-10-CM

## 2023-07-25 DIAGNOSIS — Z79899 Other long term (current) drug therapy: Secondary | ICD-10-CM | POA: Diagnosis not present

## 2023-07-25 DIAGNOSIS — K219 Gastro-esophageal reflux disease without esophagitis: Secondary | ICD-10-CM

## 2023-07-25 DIAGNOSIS — M170 Bilateral primary osteoarthritis of knee: Secondary | ICD-10-CM

## 2023-07-25 DIAGNOSIS — R931 Abnormal findings on diagnostic imaging of heart and coronary circulation: Secondary | ICD-10-CM | POA: Diagnosis not present

## 2023-07-25 DIAGNOSIS — Z23 Encounter for immunization: Secondary | ICD-10-CM | POA: Diagnosis not present

## 2023-07-25 LAB — POCT GLYCOSYLATED HEMOGLOBIN (HGB A1C): Hemoglobin A1C: 5.8 % — AB (ref 4.0–5.6)

## 2023-07-25 MED ORDER — ROSUVASTATIN CALCIUM 5 MG PO TABS: 5.0000 mg | ORAL_TABLET | Freq: Every day | ORAL | 3 refills | Status: AC

## 2023-07-25 NOTE — Progress Notes (Signed)
 Name: Jeffrey Schneider   MRN: [id number]    DOB: 11-29-1970   Date:07/25/2023       Progress Note  Subjective  Chief Complaint  Chief Complaint  Patient presents with   Medical Management of Chronic Issues   Discussed the use of AI scribe software for clinical note transcription with the patient, who gave verbal consent to proceed.  History of Present Illness Jeffrey Schneider is a 52 year old male with type 2 diabetes and dyslipidemia who presents for a six-month follow-up.  He has diabetes and monitors his blood sugar daily, with morning readings typically in the 120s. His A1c has improved from 6.3 in December to 5.8 today. Blood sugar levels initially range from 101 to 98, dropping to the 90s by 11 AM depending on his diet. He experienced a transient rise to 199 after playing basketball, which normalized to 88 an hour later. No symptoms of excessive thirst, frequent urination, or other classic diabetes symptoms.  He experiences occasional tingling in his feet, which he attributes to increased physical activity and working in the yard. The tingling is not constant, and he has been diagnosed with diabetes for about three years. He has flat feet.  For dyslipidemia, he takes rosuvastatin  5 mg. He has a history of high cholesterol and a high cardiac calcium  score. No symptoms of chest pain, palpitations, or tightness. He experiences occasional heartburn but does not take medication for it.  He has osteoarthritis in both knees, with a history of meniscus tear. Knee pain is significant, affecting his ability to climb stairs and engage in activities like basketball. He uses knee braces and takes Tylenol or Advil as needed for pain. He reports generalized body aches, especially after physical activity.  He denies any issues with sleep unless disturbed by his wife's snoring. He feels emotionally stable, with occasional work-related stress. He takes a multivitamin and is mindful of not overusing  supplements.    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   GERD without esophagitis 07/17/2021   Dysthymia 07/17/2021   Dyslipidemia associated with type 2 diabetes mellitus (HCC) 05/26/2019   Medial meniscus tear 11/08/2017   Lateral meniscus tear 01/30/2016   Bilateral knee pain 09/27/2014   Dyslipidemia 09/24/2014   Hemorrhoids, internal 09/24/2014   Dysmetabolic syndrome 09/24/2014   Obesity (BMI 30.0-34.9) 09/24/2014   Osteoarthritis of left knee 09/24/2014    Past Surgical History:  Procedure Laterality Date   ACHILLES TENDON SURGERY Left    Past Rupture    Family History  Problem Relation Age of Onset   Obesity Mother    Schizophrenia Mother    Atrial fibrillation Mother    Diabetes Mother    Alcohol abuse Father    Cirrhosis Father    Cirrhosis Sister    Alcohol abuse Brother    Allergic rhinitis Daughter    Allergic rhinitis Son    Cancer Maternal Grandmother        Colon    Social History   Tobacco Use   Smoking status: Never   Smokeless tobacco: Never  Substance Use Topics   Alcohol use: No    Alcohol/week: 0.0 standard drinks of alcohol     Current Outpatient Medications:    aspirin  EC 81 MG tablet, Take 81 mg by mouth daily. Swallow whole., Disp: , Rfl:    rosuvastatin  (CRESTOR ) 5 MG tablet, TAKE 1  TABLET (5 MG TOTAL) BY MOUTH DAILY., Disp: 30 tablet, Rfl: 0  No Known Allergies  I personally reviewed active problem list, medication list, allergies with the patient/caregiver today.   ROS  Ten systems reviewed and is negative except as mentioned in HPI    Objective Physical Exam  CONSTITUTIONAL: Patient appears well-developed and well-nourished. No distress. HEENT: Head atraumatic, normocephalic, neck supple. CARDIOVASCULAR: Normal rate, regular rhythm and normal heart sounds. No murmur heard. No BLE edema. Peripheral pulses  palpable in feet. Monofilament test normal. PULMONARY: Effort normal and breath sounds normal. No respiratory distress. ABDOMINAL: There is no tenderness or distention. MUSCULOSKELETAL: Normal gait. Without gross motor or sensory deficit. PSYCHIATRIC: Patient has a normal mood and affect. Behavior is normal. Judgment and thought content normal.  Vitals:   07/25/23 0830  BP: 114/74  Pulse: 97  Resp: 16  SpO2: 98%  Weight: 253 lb 4.8 oz (114.9 kg)  Height: 6' 2.25 (1.886 m)    Body mass index is 32.3 kg/m.  Recent Results (from the past 2160 hours)  POCT glycosylated hemoglobin (Hb A1C)     Status: Abnormal   Collection Time: 07/25/23  8:34 AM  Result Value Ref Range   Hemoglobin A1C 5.8 (A) 4.0 - 5.6 %   HbA1c POC (<> result, manual entry)     HbA1c, POC (prediabetic range)     HbA1c, POC (controlled diabetic range)      Diabetic Foot Exam:  Diabetic foot exam was performed with the following findings:   Normal sensation of 10g monofilament Intact posterior tibialis and dorsalis pedis pulses Damaged nails also has corn formation on 5 th toes       PHQ2/9:    07/25/2023    8:28 AM 01/13/2023    8:07 AM 11/30/2022    8:34 AM 07/26/2022    7:53 AM 07/26/2022    7:47 AM  Depression screen PHQ 2/9  Decreased Interest 0 0 1 1 0  Down, Depressed, Hopeless 0 0 1 1 0  PHQ - 2 Score 0 0 2 2 0  Altered sleeping 0 0 0 0 0  Tired, decreased energy 0 0 0 1 0  Change in appetite 0 0 0 0 0  Feeling bad or failure about yourself  0 0 0 0 0  Trouble concentrating 0 0 0 0 0  Moving slowly or fidgety/restless 0 0 0 0 0  Suicidal thoughts 0 0 0 0 0  PHQ-9 Score 0 0 2 3 0  Difficult doing work/chores Not difficult at all Not difficult at all Not difficult at all Not difficult at all Not difficult at all    phq 9 is negative  Fall Risk:    07/25/2023    8:23 AM 11/30/2022    8:40 AM 07/26/2022    7:53 AM 07/26/2022    7:47 AM 01/19/2022    8:24 AM  Fall Risk   Falls in the past  year? 0 0 0 0 0  Number falls in past yr: 0  0 0 0  Injury with Fall? 0  0 0 0  Risk for fall due to : No Fall Risks No Fall Risks  No Fall Risks No Fall Risks  Follow up Falls evaluation completed Falls prevention discussed;Education provided;Falls evaluation completed  Falls prevention discussed;Education provided;Falls evaluation completed Falls prevention discussed      Data saved with a previous flowsheet row definition      Assessment & Plan Type 2 diabetes mellitus Well-controlled with  HbA1c at 5.8%. Fasting glucose in 120s. No hyperglycemia symptoms. Intermittent foot tingling not consistent with neuropathy. - Order comprehensive metabolic panel. - Order urine microalbumin to creatinine ratio. - Continue current dietary management. - Administer pneumonia vaccination.  Hyperlipidemia Managed with rosuvastatin  5 mg daily. No cardiovascular symptoms. LDL goal below 70 mg/dL. Discussed potential rosuvastatin  dosage increase. Advised muscle aches not due to rosuvastatin . - Order lipid panel. - Continue rosuvastatin  5 mg daily. - Prescribe a year's supply of rosuvastatin , advise not to pick up until LDL results are reviewed.  Gastroesophageal reflux disease Occasional and mild. Managed without medication. - Consider using Tums for occasional heartburn if needed.  Bilateral knee osteoarthritis Worsening with increased pain and activity difficulty. Managed with Tylenol or Advil. Emphasized stretching and strengthening exercises. - Recommend stretching and muscle strengthening exercises. - Use Tylenol or Advil as needed for pain.

## 2023-07-26 LAB — COMPREHENSIVE METABOLIC PANEL WITH GFR
AG Ratio: 1.8 (calc) (ref 1.0–2.5)
ALT: 19 U/L (ref 9–46)
AST: 17 U/L (ref 10–35)
Albumin: 4.8 g/dL (ref 3.6–5.1)
Alkaline phosphatase (APISO): 74 U/L (ref 35–144)
BUN: 22 mg/dL (ref 7–25)
CO2: 27 mmol/L (ref 20–32)
Calcium: 10 mg/dL (ref 8.6–10.3)
Chloride: 106 mmol/L (ref 98–110)
Creat: 1.23 mg/dL (ref 0.70–1.30)
Globulin: 2.6 g/dL (ref 1.9–3.7)
Glucose, Bld: 97 mg/dL (ref 65–99)
Potassium: 4.3 mmol/L (ref 3.5–5.3)
Sodium: 142 mmol/L (ref 135–146)
Total Bilirubin: 0.6 mg/dL (ref 0.2–1.2)
Total Protein: 7.4 g/dL (ref 6.1–8.1)
eGFR: 72 mL/min/1.73m2 (ref >=60)

## 2023-07-26 LAB — CBC WITH DIFFERENTIAL/PLATELET
Absolute Lymphocytes: 1510 {cells}/uL (ref 850–3900)
Absolute Monocytes: 289 {cells}/uL (ref 200–950)
Basophils Absolute: 18 {cells}/uL (ref 0–200)
Basophils Relative: 0.3 %
Eosinophils Absolute: 100 {cells}/uL (ref 15–500)
Eosinophils Relative: 1.7 %
HCT: 44.6 % (ref 38.5–50.0)
Hemoglobin: 14.8 g/dL (ref 13.2–17.1)
MCH: 29.4 pg (ref 27.0–33.0)
MCHC: 33.2 g/dL (ref 32.0–36.0)
MCV: 88.5 fL (ref 80.0–100.0)
MPV: 10.1 fL (ref 7.5–12.5)
Monocytes Relative: 4.9 %
Neutro Abs: 3983 {cells}/uL (ref 1500–7800)
Neutrophils Relative %: 67.5 %
Platelets: 205 Thousand/uL (ref 140–400)
RBC: 5.04 Million/uL (ref 4.20–5.80)
RDW: 13.1 % (ref 11.0–15.0)
Total Lymphocyte: 25.6 %
WBC: 5.9 Thousand/uL (ref 3.8–10.8)

## 2023-07-26 LAB — MICROALBUMIN / CREATININE URINE RATIO
Creatinine, Urine: 172 mg/dL (ref 20–320)
Microalb, Ur: <0.2 mg/dL

## 2023-07-26 LAB — LIPID PANEL
Cholesterol: 139 mg/dL (ref <200)
HDL: 45 mg/dL (ref >=40)
LDL Cholesterol (Calc): 75 mg/dL
Non-HDL Cholesterol (Calc): 94 mg/dL (ref <130)
Total CHOL/HDL Ratio: 3.1 (calc) (ref <5.0)
Triglycerides: 104 mg/dL (ref <150)

## 2023-07-29 ENCOUNTER — Ambulatory Visit: Payer: Self-pay | Admitting: Family Medicine

## 2023-08-19 DIAGNOSIS — E119 Type 2 diabetes mellitus without complications: Secondary | ICD-10-CM | POA: Diagnosis not present

## 2023-09-19 DIAGNOSIS — E119 Type 2 diabetes mellitus without complications: Secondary | ICD-10-CM | POA: Diagnosis not present

## 2023-10-19 DIAGNOSIS — E119 Type 2 diabetes mellitus without complications: Secondary | ICD-10-CM | POA: Diagnosis not present

## 2023-11-08 ENCOUNTER — Telehealth: Payer: Self-pay

## 2023-11-08 NOTE — Telephone Encounter (Signed)
 Copied from CRM [id number]. Topic: General - Other >> Nov 08, 2023  2:18 PM Yolanda T wrote: Reason for CRM: patient called back to let Sherrilyn know he had an exam the early part of the year around Feb

## 2023-11-09 ENCOUNTER — Encounter: Payer: Self-pay | Admitting: Family Medicine

## 2023-11-09 NOTE — Telephone Encounter (Signed)
 Patient state he got exam at [facility] doctor on [address]

## 2023-11-19 DIAGNOSIS — E119 Type 2 diabetes mellitus without complications: Secondary | ICD-10-CM | POA: Diagnosis not present

## 2023-11-29 NOTE — Patient Instructions (Signed)

## 2023-11-30 ENCOUNTER — Ambulatory Visit (INDEPENDENT_AMBULATORY_CARE_PROVIDER_SITE_OTHER): Payer: Self-pay | Admitting: Family Medicine

## 2023-11-30 ENCOUNTER — Encounter: Payer: Self-pay | Admitting: Family Medicine

## 2023-11-30 ENCOUNTER — Telehealth: Payer: Self-pay

## 2023-11-30 VITALS — BP 124/80 | HR 73 | Resp 16 | Ht 74.0 in | Wt 256.7 lb

## 2023-11-30 DIAGNOSIS — E1169 Type 2 diabetes mellitus with other specified complication: Secondary | ICD-10-CM | POA: Diagnosis not present

## 2023-11-30 DIAGNOSIS — Z1211 Encounter for screening for malignant neoplasm of colon: Secondary | ICD-10-CM

## 2023-11-30 DIAGNOSIS — Z0001 Encounter for general adult medical examination with abnormal findings: Secondary | ICD-10-CM

## 2023-11-30 DIAGNOSIS — R42 Dizziness and giddiness: Secondary | ICD-10-CM | POA: Diagnosis not present

## 2023-11-30 DIAGNOSIS — Z0000 Encounter for general adult medical examination without abnormal findings: Secondary | ICD-10-CM

## 2023-11-30 DIAGNOSIS — E785 Hyperlipidemia, unspecified: Secondary | ICD-10-CM | POA: Diagnosis not present

## 2023-11-30 DIAGNOSIS — I77810 Thoracic aortic ectasia: Secondary | ICD-10-CM | POA: Insufficient documentation

## 2023-11-30 NOTE — Progress Notes (Signed)
 Name: Jeffrey Schneider   MRN: [id number]    DOB: 1970/05/12   Date:11/30/2023       Progress Note  Subjective  Chief Complaint  Chief Complaint  Patient presents with   Annual Exam    HPI  Patient presents for annual CPE and follow up and follow up  Discussed the use of AI scribe software for clinical note transcription with the patient, who gave verbal consent to proceed.  History of Present Illness Jeffrey Schneider is a 53 year old male with coronary calcification and mild aortic root dilation who presents with concerns about elevated heart rate during exercise and episodes of dizziness.  Over the past few months, he has experienced elevated heart rates while using the elliptical machine, with the highest recorded being 209 bpm. These episodes occur approximately once a month and resolve quickly without associated symptoms such as chest pain, dizziness, or nausea. He monitors his heart rate using a wrist device, which may not be entirely accurate. He is under the care of a cardiologist and takes aspirin  and cholesterol medication.  He describes experiencing episodes of dizziness, which he believes might be vertigo. About a month ago, he felt off balance while going to bed and again when he got up to use the bathroom. He describes the sensation as 'wobbly' rather than spinning and notes that it was brief and resolved without nausea or weakness. He has not identified a clear pattern or trigger for these episodes, which have been sporadic.  He is mindful of his diet and physical activity, noting that his morning blood sugar levels are generally good, averaging around 104 to 114 mg/dL. He engages in regular physical activity, including playing basketball during lunch breaks and sometimes on Saturdays. Intense workouts can cause his blood sugar to rise temporarily. He has a history of an enlarged thyroid , which was previously evaluated with an ultrasound showing no nodules, and his thyroid   function tests have been normal.  He is due for a colonoscopy in February and has a history of a hernia. He is also considering getting the shingles vaccine, as he has not yet received it.         IPSS     Row Name 11/30/23 0906         International Prostate Symptom Score   How often have you had the sensation of not emptying your bladder? Not at All     How often have you had to urinate less than every two hours? Not at All     How often have you found you stopped and started again several times when you urinated? Not at All     How often have you found it difficult to postpone urination? Less than 1 in 5 times     How often have you had a weak urinary stream? Not at All     How often have you had to strain to start urination? Not at All     How many times did you typically get up at night to urinate? 1 Time     Total IPSS Score 2       Quality of Life due to urinary symptoms   If you were to spend the rest of your life with your urinary condition just the way it is now how would you feel about that? Mostly Satisfied        Diet: mindful about his diet  Exercise: continue regular physical activity  Last Dental Exam: up  to date  Last Eye Exam: up to date   Depression: phq 9 is negative    11/30/2023    9:00 AM 07/25/2023    8:28 AM 01/13/2023    8:07 AM 11/30/2022    8:34 AM 07/26/2022    7:53 AM  Depression screen PHQ 2/9  Decreased Interest 0 0 0 1 1  Down, Depressed, Hopeless 0 0 0 1 1  PHQ - 2 Score 0 0 0 2 2  Altered sleeping 0 0 0 0 0  Tired, decreased energy 0 0 0 0 1  Change in appetite 0 0 0 0 0  Feeling bad or failure about yourself  0 0 0 0 0  Trouble concentrating 0 0 0 0 0  Moving slowly or fidgety/restless 0 0 0 0 0  Suicidal thoughts 0 0 0 0 0  PHQ-9 Score 0 0  0  2  3   Difficult doing work/chores Not difficult at all Not difficult at all Not difficult at all Not difficult at all Not difficult at all     Data saved with a previous flowsheet row  definition    Hypertension:  BP Readings from Last 3 Encounters:  11/30/23 124/80  07/25/23 114/74  04/15/23 124/74    Obesity: Wt Readings from Last 3 Encounters:  11/30/23 256 lb 11.2 oz (116.4 kg)  07/25/23 253 lb 4.8 oz (114.9 kg)  04/15/23 250 lb (113.4 kg)   BMI Readings from Last 3 Encounters:  11/30/23 32.96 kg/m  07/25/23 32.30 kg/m  04/15/23 31.88 kg/m     Flowsheet Row Office Visit from 11/30/2023 in [facility]  AUDIT-C Score 0     Married STD testing and prevention (HIV/chl/gon/syphilis):  not applicable Sexual history: no problems  Hep C Screening: completed Skin cancer: Discussed monitoring for atypical lesions Colorectal cancer: due January 2026  Prostate cancer:  yes Lab Results  Component Value Date   PSA Normal, 0.5 02/21/2014     Lung cancer:  Low Dose CT Chest recommended if Age 37-80 years, 30 pack-year currently smoking OR have quit w/in 15years. Patient  is not a candidate for screening   AAA: The USPSTF recommends one-time screening with ultrasonography in men ages 66 to 75 years who have ever smoked. Patient   is not a candidate for screening  ECG:  2024  Vaccines: reviewed with the patient. Discussed shingrix   Advanced Care Planning: A voluntary discussion about advance care planning including the explanation and discussion of advance directives.  Discussed health care proxy and Living will, and the patient was able to identify a health care proxy as wife .  Patient does not have a living will and power of attorney of health care   Patient Active Problem List   Diagnosis Date Noted   Primary osteoarthritis of both knees 01/19/2022   GERD without esophagitis 07/17/2021   Dysthymia 07/17/2021   Dyslipidemia associated with type 2 diabetes mellitus (HCC) 05/26/2019   Medial meniscus tear 11/08/2017   Lateral meniscus tear 01/30/2016   Dyslipidemia 09/24/2014   Hemorrhoids, internal 09/24/2014   Obesity  (BMI 30.0-34.9) 09/24/2014    Past Surgical History:  Procedure Laterality Date   ACHILLES TENDON SURGERY Left    Past Rupture   FRACTURE SURGERY      Family History  Problem Relation Age of Onset   Obesity Mother    Schizophrenia Mother    Atrial fibrillation Mother    Diabetes Mother    Alcohol  abuse Father    Cirrhosis Father    Cirrhosis Sister    Alcohol abuse Brother    Allergic rhinitis Daughter    Allergic rhinitis Son    Cancer Maternal Grandmother        Colon    Social History   Socioeconomic History   Marital status: Married    Spouse name: [name]   Number of children: 2   Years of education: Not on file   Highest education level: Master's degree (e.g., MA, MS, MEng, MEd, MSW, MBA)  Occupational History   Occupation: Associate Professor: [employer]  Tobacco Use   Smoking status: Never   Smokeless tobacco: Never  Vaping Use   Vaping status: Never Used  Substance and Sexual Activity   Alcohol use: No    Alcohol/week: 0.0 standard drinks of alcohol   Drug use: No   Sexual activity: Yes    Partners: Female  Other Topics Concern   Not on file  Social History Narrative   Married   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   Two children, still at home ( teenagers )    Social Drivers of Corporate Investment Banker Strain: Low Risk  (11/26/2023)   Overall Financial Resource Strain (CARDIA)    Difficulty of Paying Living Expenses: Not hard at all  Food Insecurity: No Food Insecurity (11/26/2023)   Hunger Vital Sign    Worried About Running Out of Food in the Last Year: Never true    Ran Out of Food in the Last Year: Never true  Transportation Needs: No Transportation Needs (11/26/2023)   PRAPARE - Administrator, Civil Service (Medical): No    Lack of Transportation (Non-Medical): No  Physical Activity: Insufficiently Active (11/26/2023)   Exercise Vital Sign    Days of Exercise per Week: 4 days    Minutes of Exercise per  Session: 30 min  Stress: No Stress Concern Present (11/26/2023)   [employer] of Occupational Health - Occupational Stress Questionnaire    Feeling of Stress: Not at all  Social Connections: Moderately Integrated (11/26/2023)   Social Connection and Isolation Panel    Frequency of Communication with Friends and Family: More than three times a week    Frequency of Social Gatherings with Friends and Family: Once a week    Attends Religious Services: More than 4 times per year    Active Member of [employer] or Organizations: No    Attends Banker Meetings: Not on file    Marital Status: Married  Intimate Partner Violence: Unknown (11/30/2022)   Humiliation, Afraid, Rape, and Kick questionnaire    Fear of Current or Ex-Partner: Not on file    Emotionally Abused: No    Physically Abused: No    Sexually Abused: No     Current Outpatient Medications:    aspirin  EC 81 MG tablet, Take 81 mg by mouth daily. Swallow whole., Disp: , Rfl:    rosuvastatin  (CRESTOR ) 5 MG tablet, Take 1 tablet (5 mg total) by mouth daily., Disp: 90 tablet, Rfl: 3  No Known Allergies   ROS  Constitutional: Negative for fever or weight change.  Respiratory: Negative for cough and shortness of breath.   Cardiovascular: Negative for chest pain or palpitations.  Gastrointestinal: Negative for abdominal pain, no bowel changes.  Musculoskeletal: Negative for gait problem or joint swelling.  Skin: Negative for rash.  Neurological: Negative for dizziness or headache.  No other specific complaints  in a complete review of systems (except as listed in HPI above).    Objective  Vitals:   11/30/23 0908  BP: 124/80  Pulse: 73  Resp: 16  SpO2: 95%  Weight: 256 lb 11.2 oz (116.4 kg)  Height: 6' 2 (1.88 m)    Body mass index is 32.96 kg/m.  Physical Exam  Constitutional: Patient appears well-developed and well-nourished. No distress.  HENT: Head: Normocephalic and atraumatic. Ears: B TMs ok, no  erythema or effusion; Nose: Nose normal. Mouth/Throat: Oropharynx is clear and moist. No oropharyngeal exudate.  Eyes: Conjunctivae and EOM are normal. Pupils are equal, round, and reactive to light. No scleral icterus.  Neck: Normal range of motion. Neck supple. No JVD present. No thyromegaly present.  Cardiovascular: Normal rate, regular rhythm and normal heart sounds.  No murmur heard. No BLE edema. Pulmonary/Chest: Effort normal and breath sounds normal. No respiratory distress. Abdominal: Soft. Bowel sounds are normal, no distension. There is no tenderness. no masses MALE GENITALIA: Normal descended testes bilaterally, no masses palpated, no hernias, no lesions, no discharge RECTAL:not done  Musculoskeletal: Normal range of motion, no joint effusions. No gross deformities Neurological: he is alert and oriented to person, place, and time. No cranial nerve deficit. Coordination, balance, strength, speech and gait are normal.  Skin: Skin is warm and dry. No rash noted. No erythema.  Psychiatric: Patient has a normal mood and affect. behavior is normal. Judgment and thought content normal.      Assessment & Plan Adult Wellness Visit Routine wellness visit focused on physical activity and healthy lifestyle. - Continue physical activity and healthy lifestyle. - Encouraged stretching before and after exercise. - Consider swimming as an alternative to high-impact activities.  Type 2 diabetes mellitus with associated dyslipidemia Well-controlled with A1c of 5.8. Stable blood glucose with occasional post-exercise elevation. - Continue current diabetes management plan. - Monitor blood glucose levels regularly.  Coronary artery calcification Calcium  score of 12.2. No chest pain. Normal echocardiogram. Mild aortic root dilation without structural issues. - Continue aspirin  and cholesterol medication. - Follow up with cardiologist as scheduled.  Mild dilation of aortic root Noted on  echocardiogram without structural problems. - Continue monitoring with cardiologist.  Screening for malignant neoplasm of colon Colonoscopy due in February for screening. - Referred for colonoscopy in February.  Dizziness Intermittent dizziness, possible benign paroxysmal positional vertigo. No concerning symptoms. - Monitor for patterns or triggers. - Document episodes in a phone note for further evaluation if needed.  Enlarged thyroid  without nodules or dysfunction Enlarged thyroid  on ultrasound, normal labs. - Continue monitoring thyroid  function.  Possible Left inguinal hernia, asymptomatic Asymptomatic with slight weakness noted. - Advised using quadriceps instead of abdominal muscles when lifting.      -Prostate cancer screening and PSA options (with potential risks and benefits of testing vs not testing) were discussed along with recent recs/guidelines. -USPSTF grade A and B recommendations reviewed with patient; age-appropriate recommendations, preventive care, screening tests, etc discussed and encouraged; healthy living encouraged; see AVS for patient education given to patient -Discussed importance of 150 minutes of physical activity weekly, eat two servings of fish weekly, eat one serving of tree nuts ( cashews, pistachios, pecans, almonds.SABRA) every other day, eat 6 servings of fruit/vegetables daily and drink plenty of water and avoid sweet beverages.  -Reviewed Health Maintenance: yes

## 2023-11-30 NOTE — Telephone Encounter (Signed)
 Called patient 1x to schedule his colonoscopy but had to leave him a detailed voicemail to call me back with my name and phone number.

## 2023-12-01 ENCOUNTER — Other Ambulatory Visit: Payer: Self-pay

## 2023-12-01 DIAGNOSIS — Z1211 Encounter for screening for malignant neoplasm of colon: Secondary | ICD-10-CM

## 2023-12-01 MED ORDER — NA SULFATE-K SULFATE-MG SULF 17.5-3.13-1.6 GM/177ML PO SOLN
354.0000 mL | Freq: Once | ORAL | 0 refills | Status: AC
Start: 2023-12-01 — End: 2023-12-01

## 2023-12-01 NOTE — Telephone Encounter (Signed)
 Gastroenterology Pre-Procedure Review  Request Date: 01/31/2024 Requesting Physician: Dr. Jinny  PATIENT REVIEW QUESTIONS: The patient responded to the following health history questions as indicated:    1. Are you having any GI issues? no 2. Do you have a personal history of Polyps? no 3. Do you have a family history of Colon Cancer or Polyps? no 4. Diabetes Mellitus? no 5. Joint replacements in the past 12 months?no 6. Major health problems in the past 3 months?no 7. Any artificial heart valves, MVP, or defibrillator?no    MEDICATIONS & ALLERGIES:    Patient reports the following regarding taking any anticoagulation/antiplatelet therapy:   Plavix, Coumadin, Eliquis, Xarelto, Lovenox, Pradaxa, Brilinta, or Effient? no Aspirin ? yes (Aspirin  81 MG)  Patient confirms/reports the following medications:  Current Outpatient Medications  Medication Sig Dispense Refill   aspirin  EC 81 MG tablet Take 81 mg by mouth daily. Swallow whole.     rosuvastatin  (CRESTOR ) 5 MG tablet Take 1 tablet (5 mg total) by mouth daily. 90 tablet 3   No current facility-administered medications for this visit.    Patient confirms/reports the following allergies:  No Known Allergies  No orders of the defined types were placed in this encounter.   AUTHORIZATION INFORMATION Primary Insurance: 1D#: Group #:  Secondary Insurance: 1D#: Group #:  SCHEDULE INFORMATION: Date: 01/31/2024 Time: Location:[facility]  Dr. Jinny

## 2023-12-01 NOTE — Addendum Note (Signed)
 Addended by: JODIE HEADINGS on: 12/01/2023 10:02 AM   Modules accepted: Orders

## 2023-12-01 NOTE — Telephone Encounter (Signed)
 Patient called me back yesterday in the afternoon and left me a voicemail to return his call.  Called 2x to schedule his colonoscopy and left him a voicemail to call me back and when it was the best time to call him back.

## 2023-12-02 ENCOUNTER — Encounter: Payer: Self-pay | Admitting: Cardiology

## 2023-12-02 ENCOUNTER — Ambulatory Visit (INDEPENDENT_AMBULATORY_CARE_PROVIDER_SITE_OTHER)

## 2023-12-02 ENCOUNTER — Ambulatory Visit: Attending: Cardiology | Admitting: Cardiology

## 2023-12-02 VITALS — BP 105/78 | HR 61 | Ht 74.0 in | Wt 255.8 lb

## 2023-12-02 DIAGNOSIS — R000 Tachycardia, unspecified: Secondary | ICD-10-CM

## 2023-12-02 DIAGNOSIS — I77810 Thoracic aortic ectasia: Secondary | ICD-10-CM | POA: Diagnosis not present

## 2023-12-02 DIAGNOSIS — I4719 Other supraventricular tachycardia: Secondary | ICD-10-CM

## 2023-12-02 DIAGNOSIS — I2510 Atherosclerotic heart disease of native coronary artery without angina pectoris: Secondary | ICD-10-CM

## 2023-12-02 NOTE — Progress Notes (Signed)
 Cardiology Office Note   Date:  12/02/2023  ID:  REVEL STELLMACH, DOB June 28, 1970, MRN [id number] PCP: Sowles, Krichna, MD  [facility] Providers Cardiologist:  Redell Cave, MD     History of Present Illness Jeffrey Schneider is a 53 y.o. male with a past medical history of coronary artery calcification, pure hypercholesterolemia, GERD, type 2 diabetes, obesity, is here to follow-up.   He was last seen in clinic 10/25/2022 by Dr.Agbor-Etang at that time he was referred for cardiac evaluation of a coronary artery calcium  score with no personal cardiac history.  Previous coronary calcium  scan completed 08/04/2022 with a calcium  score of 12.2/73rd percentile noted.  He continues to deny any chest pain or shortness of breath.  He had remained very active.  Denied any family history of heart disease stated that he had been compliant with his rosuvastatin  prescribed for his hyperlipidemia without any adverse side effects.  LDL has been adequately controlled.  He was continued on aspirin .  Scheduled for an echocardiogram to rule out any structural abnormalities and no further medication changes were made.  Echocardiogram revealed LVEF 55 to 60%, no RWMA, and no valvular abnormalities were noted.  There was mild dilatation of the aortic root measuring 44 mm.   He was last seen in clinic 12/08/2018 reports he was doing well from a cardiac perspective.  Denies chest pain, shortness of breath, palpitations or lightheadedness.  Does continue to be active playing basketball.  Denied any hospitalizations or visits to the emergency department.  There were no medication changes that were made and further testing that was ordered at that time.   He returns to clinic today stating overall he has done well from a cardiac perspective.  He has noted in the last couple of months that he has been having extreme tachycardia when he is using the elliptical or has been playing basketball.  He is continue to  remain active without chest pain or shortness of breath.  But the tachycardia is concerning.  He brought his phone in today that shows elevations of up over 200 bpm.  He has also noted that he feels  wobbly.  He states that he has been compliant with his current medication regimen.  Denies any hospitalizations or visits to the emergency department.  ROS: 10 point review of system has been reviewed and considered negative except ones been listed in HPI  Studies Reviewed EKG Interpretation Date/Time:  Friday December 02 2023 08:30:39 EST Ventricular Rate:  61 PR Interval:  166 QRS Duration:  90 QT Interval:  400 QTC Calculation: 402 R Axis:   -6  Text Interpretation: Normal sinus rhythm Minimal voltage criteria for LVH, may be normal variant ( R in aVL ) When compared with ECG of 25-Oct-2022 08:47, No significant change was found Confirmed by Gerard Frederick ([id number]) on 12/02/2023 8:48:38 AM    2D echo 11/16/22 1. Left ventricular ejection fraction, by estimation, is 55 to 60%. The  left ventricle has normal function. The left ventricle has no regional  wall motion abnormalities. Left ventricular diastolic parameters were  normal. The average left ventricular  global longitudinal strain is -19.4 %.   2. Right ventricular systolic function is normal. The right ventricular  size is normal. Tricuspid regurgitation signal is inadequate for assessing  PA pressure.   3. The mitral valve is normal in structure. No evidence of mitral valve  regurgitation. No evidence of mitral stenosis.   4. The aortic valve is tricuspid. Aortic valve  regurgitation is not  visualized. No aortic stenosis is present.   5. There is mild dilatation of the aortic root, measuring 44 mm.   6. The inferior vena cava is normal in size with greater than 50%  respiratory variability, suggesting right atrial pressure of 3 mmHg.   Risk Assessment/Calculations           Physical Exam VS:  BP 105/78 (BP Location: Left  Arm, Patient Position: Sitting, Cuff Size: Large)   Pulse 61 Comment: 76 oximeter  Ht 6' 2 (1.88 m)   Wt 255 lb 12.8 oz (116 kg)   SpO2 98%   BMI 32.84 kg/m        Wt Readings from Last 3 Encounters:  12/02/23 255 lb 12.8 oz (116 kg)  11/30/23 256 lb 11.2 oz (116.4 kg)  07/25/23 253 lb 4.8 oz (114.9 kg)    GEN: Well nourished, well developed in no acute distress NECK: No JVD; No carotid bruits CARDIAC: RRR, no murmurs, rubs, gallops RESPIRATORY:  Clear to auscultation without rales, wheezing or rhonchi  ABDOMEN: Soft, non-tender, non-distended EXTREMITIES:  No edema; No deformity   ASSESSMENT AND PLAN Coronary calcification with a calcium  score of 12.2.  He continues to deny angina or anginal equivalents and has no symptoms of decompensation.  He has continued on aspirin  81 mg daily and statin therapy.  He continues to remain active with playing basketball and working out.  EKG today reveals sinus rhythm with LVH, with rate of 61, with no ischemic changes noted.  Hyperlipidemia with an LDL of 75.  Cholesterol is continued to be well-controlled on rosuvastatin  5 mg daily.  Mild aortic root dilatation measuring 44 mm noted on his last echocardiogram.  He has been scheduled for an updated echo for reevaluation and surveillance studies.  Tachycardia noted during activity at home upwards of heart rates up to 200 bpm.  He has been placed on a ZIO XT monitor for 7 days to rule out any arrhythmia.  If he does have anything concerning could consider doing exercise tolerance test.  EKG is unrevealing today and he continues to deny any chest pain or associated symptoms.       Dispo: Patient to return to clinic to see MD/APP in 11 to 12 months or sooner if needed for reevaluation.  He has been advised that if any changes on the monitor that require any further testing or his echocardiogram that his return appointment will be moved up to a sooner visit.  Signed, Dewey Viens, NP

## 2023-12-02 NOTE — Patient Instructions (Signed)
 Medication Instructions:  Your physician recommends that you continue on your current medications as directed. Please refer to the Current Medication list given to you today.   *If you need a refill on your cardiac medications before your next appointment, please call your pharmacy*  Lab Work: No labs ordered today  If you have labs (blood work) drawn today and your tests are completely normal, you will receive your results only by: MyChart Message (if you have MyChart) OR A paper copy in the mail If you have any lab test that is abnormal or we need to change your treatment, we will call you to review the results.  Testing/Procedures: Your physician has requested that you have an echocardiogram. Echocardiography is a painless test that uses sound waves to create images of your heart. It provides your doctor with information about the size and shape of your heart and how well your heart's chambers and valves are working.   You may receive an ultrasound enhancing agent through an IV if needed to better visualize your heart during the echo. This procedure takes approximately one hour.  There are no restrictions for this procedure.  This will take place at [address] [facility] [address], [place] [address]  Please note: We ask at that you not bring children with you during ultrasound (echo/ vascular) testing. Due to room size and safety concerns, children are not allowed in the ultrasound rooms during exams. Our front office staff cannot provide observation of children in our lobby area while testing is being conducted. An adult accompanying a patient to their appointment will only be allowed in the ultrasound room at the discretion of the ultrasound technician under special circumstances. We apologize for any inconvenience.   ZIO XT- Long Term Monitor Instructions  Your physician has requested you wear a ZIO patch monitor for 7 days.  This is a single patch monitor. [employer]  supplies one patch monitor per enrollment. Additional stickers are not available. Please do not apply patch if you will be having a Nuclear Stress Test,  Echocardiogram, Cardiac CT, MRI, or Chest Xray during the period you would be wearing the  monitor. The patch cannot be worn during these tests. You cannot remove and re-apply the  ZIO XT patch monitor.  Your ZIO patch monitor will be mailed 3 day USPS to your address on file. It may take 3-5 days  to receive your monitor after you have been enrolled.  Once you have received your monitor, please review the enclosed instructions. Your monitor  has already been registered assigning a specific monitor serial # to you.  Billing and Patient Assistance Program Information  We have supplied [employer] with any of your insurance information on file for billing purposes. [employer] offers a sliding scale Patient Assistance Program for patients that do not have  insurance, or whose insurance does not completely cover the cost of the ZIO monitor.  You must apply for the Patient Assistance Program to qualify for this discounted rate.  To apply, please call [employer] at [phone], select option 4, select option 2, ask to apply for  Patient Assistance Program. Meredeth will ask your household income, and how many people  are in your household. They will quote your out-of-pocket cost based on that information.  [employer] will also be able to set up a 46-month, interest-free payment plan if needed.  Applying the monitor   Shave hair from upper left chest.  Hold abrader disc by orange tab. Rub abrader in  40 strokes over the upper left chest as  indicated in your monitor instructions.  Clean area with 4 enclosed alcohol pads. Let dry.  Apply patch as indicated in monitor instructions. Patch will be placed under collarbone on left  side of chest with arrow pointing upward.  Rub patch adhesive wings for 2 minutes. Remove white label marked 1. Remove the white   label marked 2. Rub patch adhesive wings for 2 additional minutes.  While looking in a mirror, press and release button in center of patch. A small green light will  flash 3-4 times. This will be your only indicator that the monitor has been turned on.  Do not shower for the first 24 hours. You may shower after the first 24 hours.  Press the button if you feel a symptom. You will hear a small click. Record Date, Time and  Symptom in the Patient Logbook.  When you are ready to remove the patch, follow instructions on the last 2 pages of Patient  Logbook. Stick patch monitor onto the last page of Patient Logbook.  Place Patient Logbook in the blue and white box. Use locking tab on box and tape box closed  securely. The blue and white box has prepaid postage on it. Please place it in the mailbox as  soon as possible. Your physician should have your test results approximately 7 days after the  monitor has been mailed back to [facility].  Call [facility] Customer Care at [phone] if you have questions regarding  your ZIO XT patch monitor. Call them immediately if you see an orange light blinking on your  monitor.  If your monitor falls off in less than 4 days, contact our Monitor department at [phone].  If your monitor becomes loose or falls off after 4 days call [employer] at [phone] for  suggestions on securing your monitor   Follow-Up: At [facility], you and your health needs are our priority.  As part of our continuing mission to provide you with exceptional heart care, our providers are all part of one team.  This team includes your primary Cardiologist (physician) and Advanced Practice Providers or APPs (Physician Assistants and Nurse Practitioners) who all work together to provide you with the care you need, when you need it.  Your next appointment:   12 month(s)  Provider:   Tylene Lunch, NP

## 2023-12-19 DIAGNOSIS — I4719 Other supraventricular tachycardia: Secondary | ICD-10-CM | POA: Diagnosis not present

## 2023-12-21 DIAGNOSIS — I4719 Other supraventricular tachycardia: Secondary | ICD-10-CM

## 2023-12-23 ENCOUNTER — Ambulatory Visit: Payer: Self-pay | Admitting: Cardiology

## 2023-12-23 DIAGNOSIS — I77810 Thoracic aortic ectasia: Secondary | ICD-10-CM

## 2023-12-23 DIAGNOSIS — E785 Hyperlipidemia, unspecified: Secondary | ICD-10-CM

## 2023-12-26 NOTE — Telephone Encounter (Signed)
 There is some literature that states that a K2 plus D3 supplement decreases the effectiveness of rosuvastatin .  If he starts taking the supplement would recommend he not take it at the same time that he takes his cholesterol medicine.  Would also recheck a lipid panel 3 months after he has been taking the medication to ensure adequate coverage from the rosuvastatin .

## 2024-01-20 ENCOUNTER — Ambulatory Visit: Attending: Cardiology

## 2024-01-20 DIAGNOSIS — I77810 Thoracic aortic ectasia: Secondary | ICD-10-CM | POA: Diagnosis not present

## 2024-01-20 LAB — ECHOCARDIOGRAM COMPLETE
AR max vel: 3.92 cm2
AV Area VTI: 3.73 cm2
AV Area mean vel: 3.74 cm2
AV Mean grad: 3 mmHg
AV Peak grad: 6.2 mmHg
Ao pk vel: 1.24 m/s
Area-P 1/2: 4.8 cm2
S' Lateral: 3.52 cm

## 2024-01-20 NOTE — Progress Notes (Signed)
 Heart squeeze is noted to be 55-60% which is normal function, there are no wall motion abnormalities noted, there is also no valvular abnormalities that are noted.  There is moderate dilatation of the aortic root measuring 46 mm which is slightly increased from previous study.  Recommend CT chest and aorta to have a better understanding of anatomy.

## 2024-01-25 ENCOUNTER — Ambulatory Visit: Admitting: Family Medicine

## 2024-01-25 ENCOUNTER — Encounter: Payer: Self-pay | Admitting: Family Medicine

## 2024-01-25 VITALS — BP 118/76 | HR 68 | Resp 16 | Ht 74.0 in | Wt 258.1 lb

## 2024-01-25 DIAGNOSIS — M170 Bilateral primary osteoarthritis of knee: Secondary | ICD-10-CM | POA: Diagnosis not present

## 2024-01-25 DIAGNOSIS — E1169 Type 2 diabetes mellitus with other specified complication: Secondary | ICD-10-CM | POA: Diagnosis not present

## 2024-01-25 DIAGNOSIS — K219 Gastro-esophageal reflux disease without esophagitis: Secondary | ICD-10-CM

## 2024-01-25 DIAGNOSIS — I77810 Thoracic aortic ectasia: Secondary | ICD-10-CM

## 2024-01-25 DIAGNOSIS — R931 Abnormal findings on diagnostic imaging of heart and coronary circulation: Secondary | ICD-10-CM

## 2024-01-25 DIAGNOSIS — E785 Hyperlipidemia, unspecified: Secondary | ICD-10-CM | POA: Diagnosis not present

## 2024-01-25 LAB — POCT GLYCOSYLATED HEMOGLOBIN (HGB A1C): Hemoglobin A1C: 6.1 % — AB (ref 4.0–5.6)

## 2024-01-25 NOTE — Progress Notes (Signed)
 Name: Jeffrey Schneider   MRN: [id number]    DOB: 02/08/1970   Date:01/25/2024       Progress Note  Subjective  Chief Complaint  Chief Complaint  Patient presents with   Medical Management of Chronic Issues   Discussed the use of AI scribe software for clinical note transcription with the patient, who gave verbal consent to proceed.  History of Present Illness Jeffrey Schneider is a 54 year old male with diabetes and aortic root dilation who presents for a regular follow-up visit.  Since his last visit in July, there are no new issues related to his aortic root dilation. His cardiologist noted a minimal increase in aortic root dilation from 44 mm to 46 mm over the past year. He has not experienced any chest pain, tightness, or shortness of breath and remains physically active. He is on medical management for cardiovascular health, including 81 mg of aspirin  and cholesterol medication. His coronary artery calcification score was 12.2, and his cholesterol was below 75 as of June. An EKG showed no ischemic changes.  For diabetes management, he controls it through diet and regularly checks his blood sugar levels. Since November, morning blood sugar levels have been in the 120s, and postprandial levels range from 109 to 116. His HbA1c has increased slightly from 5.8% in July to 6.1% currently. Previously, his HbA1c was 8.2% in 2021, but he has maintained lower levels through lifestyle changes.  He experiences issues with his right knee, including subtle pain and clicking. The pain improves with rest and the use of leg sleeves. He engages in weightlifting and uses leg sleeves for recovery, which has reduced his nighttime pain. He maintains an active lifestyle, playing basketball and using the elliptical for exercise. He has modified his activities to manage knee pain, including reducing basketball playtime and incorporating cross-training. He does not take creatine or other supplements beyond  multivitamins.    Patient Active Problem List   Diagnosis Date Noted   Aortic root dilation 11/30/2023   Primary osteoarthritis of both knees 01/19/2022   GERD without esophagitis 07/17/2021   Dysthymia 07/17/2021   Dyslipidemia associated with type 2 diabetes mellitus (HCC) 05/26/2019   Medial meniscus tear 11/08/2017   Lateral meniscus tear 01/30/2016   Dyslipidemia 09/24/2014   Hemorrhoids, internal 09/24/2014   Obesity (BMI 30.0-34.9) 09/24/2014    Past Surgical History:  Procedure Laterality Date   ACHILLES TENDON SURGERY Left    Past Rupture   FRACTURE SURGERY      Family History  Problem Relation Age of Onset   Obesity Mother    Schizophrenia Mother    Atrial fibrillation Mother    Diabetes Mother    Alcohol abuse Father    Cirrhosis Father    Cirrhosis Sister    Alcohol abuse Brother    Allergic rhinitis Daughter    Allergic rhinitis Son    Cancer Maternal Grandmother        Colon    Social History   Tobacco Use   Smoking status: Never   Smokeless tobacco: Never  Substance Use Topics   Alcohol use: No    Alcohol/week: 0.0 standard drinks of alcohol    Current Medications[1]  Allergies[2]  I personally reviewed active problem list, medication list, allergies, family history with the patient/caregiver today.   ROS  Ten systems reviewed and is negative except as mentioned in HPI    Objective Physical Exam MEASUREMENTS: Weight- 258. CONSTITUTIONAL: Patient appears well-developed and well-nourished.  No distress.  HEENT: Head atraumatic, normocephalic, neck supple. CARDIOVASCULAR: Normal rate, regular rhythm and normal heart sounds.  No murmur heard. No BLE edema. PULMONARY: Effort normal and breath sounds normal. No respiratory distress. ABDOMINAL: There is no tenderness or distention. MUSCULOSKELETAL: Normal gait. Without gross motor or sensory deficit. PSYCHIATRIC: Patient has a normal mood and affect. behavior is normal. Judgment and thought  content normal.  Vitals:   01/25/24 0828  BP: 118/76  Pulse: 68  Resp: 16  SpO2: 97%  Weight: 258 lb 1.6 oz (117.1 kg)  Height: 6' 2 (1.88 m)    Body mass index is 33.14 kg/m.  Recent Results (from the past 2160 hours)  ECHOCARDIOGRAM COMPLETE     Status: None   Collection Time: 01/20/24 10:20 AM  Result Value Ref Range   AR max vel 3.92 cm2   AV Peak grad 6.2 mmHg   Ao pk vel 1.24 m/s   S' Lateral 3.52 cm   Area-P 1/2 4.80 cm2   AV Area VTI 3.73 cm2   AV Mean grad 3.0 mmHg   AV Area mean vel 3.74 cm2   Est EF 55 - 60%   POCT glycosylated hemoglobin (Hb A1C)     Status: Abnormal   Collection Time: 01/25/24  8:33 AM  Result Value Ref Range   Hemoglobin A1C 6.1 (A) 4.0 - 5.6 %   HbA1c POC (<> result, manual entry)     HbA1c, POC (prediabetic range)     HbA1c, POC (controlled diabetic range)        PHQ2/9:    01/25/2024    8:23 AM 11/30/2023    9:00 AM 07/25/2023    8:28 AM 01/13/2023    8:07 AM 11/30/2022    8:34 AM  Depression screen PHQ 2/9  Decreased Interest 0 0 0 0 1  Down, Depressed, Hopeless 0 0 0 0 1  PHQ - 2 Score 0 0 0 0 2  Altered sleeping 0 0 0 0 0  Tired, decreased energy 0 0 0 0 0  Change in appetite 0 0 0 0 0  Feeling bad or failure about yourself  0 0 0 0 0  Trouble concentrating 0 0 0 0 0  Moving slowly or fidgety/restless 0 0 0 0 0  Suicidal thoughts 0 0 0 0 0  PHQ-9 Score 0 0 0  0  2   Difficult doing work/chores Not difficult at all Not difficult at all Not difficult at all Not difficult at all Not difficult at all     Data saved with a previous flowsheet row definition    phq 9 is negative  Fall Risk:    01/25/2024    8:23 AM 11/30/2023    9:00 AM 07/25/2023    8:23 AM 11/30/2022    8:40 AM 07/26/2022    7:53 AM  Fall Risk   Falls in the past year? 1 1 0 0 0  Number falls in past yr: 0 0 0  0  Injury with Fall? 0 0  0   0   Risk for fall due to : Impaired balance/gait Impaired balance/gait No Fall Risks No Fall Risks   Follow up  Falls evaluation completed Falls evaluation completed Falls evaluation completed Falls prevention discussed;Education provided;Falls evaluation completed      Data saved with a previous flowsheet row definition    Assessment & Plan Type 2 diabetes mellitus with dyslipidemia Type 2 diabetes managed with diet control. A1c increased to 6.1%, indicating prediabetes. Dyslipidemia  controlled with medication, cholesterol <75 mg/dL. - Continue diet control. - Monitor blood glucose regularly. - Schedule annual blood tests in July. - Consider changing diagnosis to diet-controlled diabetes.  Aortic root dilation Moderate dilation from 44 mm to 46 mm. No significant symptoms. Cardiologist recommended CT angiogram. - Order CT angiogram. - Continue aspirin  and cholesterol medication.  Primary osteoarthritis of right knee Chronic knee pain with clicking, likely osteoarthritis or meniscus involvement. Symptoms improve with rest and activity modification. - Refer to Dr. Tobie for further evaluation. - Continue physical therapy and activity modification. - Consider creatine supplementation.  High coronary artery calcium  score Coronary artery calcification score of 12.2. No ischemic symptoms. Managed with aspirin  and cholesterol medication. - Continue aspirin  and cholesterol medication.  General Health Maintenance Routine health maintenance discussed. Shingles vaccination pending. - Schedule shingles vaccination after colonoscopy. - Continue routine health maintenance and screenings.        [1]  Current Outpatient Medications:    aspirin  EC 81 MG tablet, Take 81 mg by mouth daily. Swallow whole., Disp: , Rfl:    rosuvastatin  (CRESTOR ) 5 MG tablet, Take 1 tablet (5 mg total) by mouth daily., Disp: 90 tablet, Rfl: 3 [2] No Known Allergies

## 2024-01-31 ENCOUNTER — Encounter: Payer: Self-pay | Admitting: Gastroenterology

## 2024-01-31 ENCOUNTER — Ambulatory Visit: Admitting: Anesthesiology

## 2024-01-31 ENCOUNTER — Encounter
Admission: RE | Disposition: A | Discharge status: Home / Self Care | Payer: Self-pay | Source: Home / Self Care | Attending: Gastroenterology

## 2024-01-31 ENCOUNTER — Ambulatory Visit
Admission: RE | Admit: 2024-01-31 | Discharge: 2024-01-31 | Disposition: A | Attending: Gastroenterology | Admitting: Gastroenterology

## 2024-01-31 DIAGNOSIS — E119 Type 2 diabetes mellitus without complications: Secondary | ICD-10-CM | POA: Diagnosis not present

## 2024-01-31 DIAGNOSIS — Z1211 Encounter for screening for malignant neoplasm of colon: Secondary | ICD-10-CM | POA: Diagnosis not present

## 2024-01-31 HISTORY — PX: COLONOSCOPY: SHX5424

## 2024-01-31 LAB — GLUCOSE, CAPILLARY: Glucose-Capillary: 100 mg/dL — ABNORMAL HIGH (ref 70–99)

## 2024-01-31 MED ORDER — MIDAZOLAM HCL 2 MG/2ML IJ SOLN
INTRAMUSCULAR | Status: AC
  Filled 2024-01-31: qty 2

## 2024-01-31 MED ORDER — FENTANYL CITRATE (PF) 100 MCG/2ML IJ SOLN
INTRAMUSCULAR | Status: DC | PRN
  Administered 2024-01-31 (×2): 50 ug via INTRAVENOUS

## 2024-01-31 MED ORDER — PHENYLEPHRINE 80 MCG/ML (10ML) SYRINGE FOR IV PUSH (FOR BLOOD PRESSURE SUPPORT)
PREFILLED_SYRINGE | INTRAVENOUS | Status: DC | PRN
  Administered 2024-01-31 (×2): 80 ug via INTRAVENOUS

## 2024-01-31 MED ORDER — PROPOFOL 10 MG/ML IV BOLUS
INTRAVENOUS | Status: DC | PRN
  Administered 2024-01-31: 100 mg via INTRAVENOUS

## 2024-01-31 MED ORDER — MIDAZOLAM HCL (PF) 2 MG/2ML IJ SOLN
INTRAMUSCULAR | Status: DC | PRN
  Administered 2024-01-31: 2 mg via INTRAVENOUS

## 2024-01-31 MED ORDER — LIDOCAINE HCL (CARDIAC) PF 100 MG/5ML IV SOSY
PREFILLED_SYRINGE | INTRAVENOUS | Status: DC | PRN
  Administered 2024-01-31: 40 mg via INTRAVENOUS

## 2024-01-31 MED ORDER — PROPOFOL 500 MG/50ML IV EMUL
INTRAVENOUS | Status: DC | PRN
  Administered 2024-01-31: 100 ug/kg/min via INTRAVENOUS

## 2024-01-31 MED ORDER — SODIUM CHLORIDE 0.9 % IV SOLN: INTRAVENOUS | Status: DC

## 2024-01-31 MED ORDER — FENTANYL CITRATE (PF) 100 MCG/2ML IJ SOLN
INTRAMUSCULAR | Status: AC
  Filled 2024-01-31: qty 2

## 2024-01-31 NOTE — Anesthesia Postprocedure Evaluation (Signed)
"   Anesthesia Post Note  Patient: Jeffrey Schneider  Procedure(s) Performed: COLONOSCOPY  Patient location during evaluation: Endoscopy Anesthesia Type: General Level of consciousness: awake and alert Pain management: pain level controlled Vital Signs Assessment: post-procedure vital signs reviewed and stable Respiratory status: spontaneous breathing, nonlabored ventilation, respiratory function stable and patient connected to nasal cannula oxygen Cardiovascular status: blood pressure returned to baseline and stable Postop Assessment: no apparent nausea or vomiting Anesthetic complications: no   No notable events documented.   Last Vitals:  Vitals:   01/31/24 1136 01/31/24 1146  BP: 106/62 110/67  Pulse: (!) 55 65  Resp: 16 16  Temp:    SpO2: 100% 100%    Last Pain:  Vitals:   01/31/24 1146  TempSrc:   PainSc: 0-No pain                 [name]      "

## 2024-01-31 NOTE — H&P (Signed)
 "  Rogelia Copping, MD [facility] [address], [address] [place], [place] [address] Phone: [phone] Fax : [phone]  Primary Care Physician:  Glenard Mire, MD Primary Gastroenterologist:  Dr. Copping  Pre-Procedure History & Physical: HPI:  GLENDA KUNST is a 54 y.o. male is here for a screening colonoscopy.   Past Medical History:  Diagnosis Date   Abnormal glucose    Arthritis    Cervicalgia    Decreased libido    Diabetes mellitus without complication (HCC)    Erectile dysfunction    GERD (gastroesophageal reflux disease)    Hyperlipidemia    Hypogonadism, male    Internal hemorrhoids    Left knee pain    Metabolic syndrome    Obesity    Thyroid  enlarged     Past Surgical History:  Procedure Laterality Date   ACHILLES TENDON SURGERY Left    Past Rupture   COLONOSCOPY     FRACTURE SURGERY      Prior to Admission medications  Medication Sig Start Date End Date Taking? Authorizing Provider  aspirin  EC 81 MG tablet Take 81 mg by mouth daily. Swallow whole.   Yes [provider]  rosuvastatin  (CRESTOR ) 5 MG tablet Take 1 tablet (5 mg total) by mouth daily. 07/25/23  Yes Sowles, Krichna, MD    Allergies as of 12/01/2023   (No Known Allergies)    Family History  Problem Relation Age of Onset   Obesity Mother    Schizophrenia Mother    Atrial fibrillation Mother    Diabetes Mother    Alcohol abuse Father    Cirrhosis Father    Cirrhosis Sister    Alcohol abuse Brother    Allergic rhinitis Daughter    Allergic rhinitis Son    Cancer Maternal Grandmother        Colon    Social History   Socioeconomic History   Marital status: Married    Spouse name: Bascom   Number of children: 2   Years of education: Not on file   Highest education level: Master's degree (e.g., MA, MS, MEng, MEd, MSW, MBA)  Occupational History   Occupation: Associate Professor: [employer]  Tobacco Use   Smoking status: Never   Smokeless tobacco: Never  Vaping  Use   Vaping status: Never Used  Substance and Sexual Activity   Alcohol use: No    Alcohol/week: 0.0 standard drinks of alcohol   Drug use: No   Sexual activity: Yes    Partners: Female  Other Topics Concern   Not on file  Social History Narrative   Married   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   Two children, still at home ( teenagers )    Social Drivers of Health   Tobacco Use: Low Risk (01/31/2024)   Patient History    Smoking Tobacco Use: Never    Smokeless Tobacco Use: Never    Passive Exposure: Not on file  Financial Resource Strain: Low Risk (11/26/2023)   Overall Financial Resource Strain (CARDIA)    Difficulty of Paying Living Expenses: Not hard at all  Food Insecurity: No Food Insecurity (11/26/2023)   Epic    Worried About Radiation Protection Practitioner of Food in the Last Year: Never true    Ran Out of Food in the Last Year: Never true  Transportation Needs: No Transportation Needs (11/26/2023)   Epic    Lack of Transportation (Medical): No    Lack of Transportation (Non-Medical): No  Physical  Activity: Insufficiently Active (11/26/2023)   Exercise Vital Sign    Days of Exercise per Week: 4 days    Minutes of Exercise per Session: 30 min  Stress: No Stress Concern Present (11/26/2023)   [employer] of Occupational Health - Occupational Stress Questionnaire    Feeling of Stress: Not at all  Social Connections: Moderately Integrated (11/26/2023)   Social Connection and Isolation Panel    Frequency of Communication with Friends and Family: More than three times a week    Frequency of Social Gatherings with Friends and Family: Once a week    Attends Religious Services: More than 4 times per year    Active Member of [employer] or Organizations: No    Attends Engineer, Structural: Not on file    Marital Status: Married  Intimate Partner Violence: Unknown (11/30/2022)   Humiliation, Afraid, Rape, and Kick questionnaire    Fear of Current or Ex-Partner: Not on file     Emotionally Abused: No    Physically Abused: No    Sexually Abused: No  Depression (PHQ2-9): Low Risk (01/25/2024)   Depression (PHQ2-9)    PHQ-2 Score: 0  Alcohol Screen: Low Risk (11/26/2023)   Alcohol Screen    Last Alcohol Screening Score (AUDIT): 0  Housing: Unknown (11/26/2023)   Epic    Unable to Pay for Housing in the Last Year: No    Number of Times Moved in the Last Year: Not on file    Homeless in the Last Year: No  Utilities: Not At Risk (11/30/2022)   [facility] Utilities    Threatened with loss of utilities: No  Health Literacy: Adequate Health Literacy (11/30/2022)   B1300 Health Literacy    Frequency of need for help with medical instructions: Never    Review of Systems: See HPI, otherwise negative ROS  Physical Exam: BP 128/72   Pulse 83   Temp (!) 96 F (35.6 C) (Temporal)   Resp 18   Wt 115.1 kg   SpO2 100%   BMI 32.59 kg/m  General:   Alert,  pleasant and cooperative in NAD Head:  Normocephalic and atraumatic. Neck:  Supple; no masses or thyromegaly. Lungs:  Clear throughout to auscultation.    Heart:  Regular rate and rhythm. Abdomen:  Soft, nontender and nondistended. Normal bowel sounds, without guarding, and without rebound.   Neurologic:  Alert and  oriented x4;  grossly normal neurologically.  Impression/Plan: Kwamane T Mcever is now here to undergo a screening colonoscopy.  Risks, benefits, and alternatives regarding colonoscopy have been reviewed with the patient.  Questions have been answered.  All parties agreeable. "

## 2024-01-31 NOTE — Anesthesia Preprocedure Evaluation (Signed)
"                                    Anesthesia Evaluation  Patient identified by MRN, date of birth, ID band Patient awake    Reviewed: Allergy & Precautions, NPO status , Patient's Chart, lab work & pertinent test results  History of Anesthesia Complications Negative for: history of anesthetic complications  Airway Mallampati: III  TM Distance: >3 FB Neck ROM: full    Dental no notable dental hx.    Pulmonary neg pulmonary ROS   Pulmonary exam normal        Cardiovascular negative cardio ROS Normal cardiovascular exam     Neuro/Psych  PSYCHIATRIC DISORDERS  Depression    negative neurological ROS     GI/Hepatic Neg liver ROS,GERD  ,,  Endo/Other  diabetes, Type 2    Renal/GU negative Renal ROS  negative genitourinary   Musculoskeletal   Abdominal   Peds  Hematology negative hematology ROS (+)   Anesthesia Other Findings Past Medical History: No date: Abnormal glucose No date: Arthritis No date: Cervicalgia No date: Decreased libido No date: Diabetes mellitus without complication (HCC) No date: Erectile dysfunction No date: GERD (gastroesophageal reflux disease) No date: Hyperlipidemia No date: Hypogonadism, male No date: Internal hemorrhoids No date: Left knee pain No date: Metabolic syndrome No date: Obesity No date: Thyroid  enlarged  Past Surgical History: No date: ACHILLES TENDON SURGERY; Left     Comment:  Past Rupture No date: COLONOSCOPY No date: FRACTURE SURGERY  BMI    Body Mass Index: 32.59 kg/m      Reproductive/Obstetrics negative OB ROS                              Anesthesia Physical Anesthesia Plan  ASA: 2  Anesthesia Plan: General   Post-op Pain Management: Minimal or no pain anticipated   Induction: Intravenous  PONV Risk Score and Plan: 1 and Propofol  infusion and TIVA  Airway Management Planned: Natural Airway and Nasal Cannula  Additional Equipment:   Intra-op Plan:    Post-operative Plan:   Informed Consent: I have reviewed the patients History and Physical, chart, labs and discussed the procedure including the risks, benefits and alternatives for the proposed anesthesia with the patient or authorized representative who has indicated his/her understanding and acceptance.     Dental Advisory Given  Plan Discussed with: Anesthesiologist, CRNA and Surgeon  Anesthesia Plan Comments: (Patient consented for risks of anesthesia including but not limited to:  - adverse reactions to medications - risk of airway placement if required - damage to eyes, teeth, lips or other oral mucosa - nerve damage due to positioning  - sore throat or hoarseness - Damage to heart, brain, nerves, lungs, other parts of body or loss of life  Patient voiced understanding and assent.)        Anesthesia Quick Evaluation  "

## 2024-01-31 NOTE — Transfer of Care (Signed)
 Immediate Anesthesia Transfer of Care Note  Patient: Jeffrey Schneider  Procedure(s) Performed: COLONOSCOPY  Patient Location: PACU  Anesthesia Type:General  Level of Consciousness: awake, alert , and oriented  Airway & Oxygen Therapy: Patient Spontanous Breathing and Patient connected to nasal cannula oxygen  Post-op Assessment: Report given to RN, Post -op Vital signs reviewed and stable, and Patient moving all extremities  Post vital signs: Reviewed and stable  Last Vitals:  Vitals Value Taken Time  BP 106/62 01/31/24 11:36  Temp    Pulse 71 01/31/24 11:37  Resp 15 01/31/24 11:37  SpO2 99 % 01/31/24 11:37  Vitals shown include unfiled device data.  Last Pain:  Vitals:   01/31/24 1135  TempSrc:   PainSc: 0-No pain         Complications: No notable events documented.

## 2024-01-31 NOTE — Op Note (Signed)
 [facility] Gastroenterology Patient Name: Jeffrey Schneider Procedure Date: 01/31/2024 11:04 AM MRN: [id number] Account #: [id number] Date of Birth: 1970/10/16 Admit Type: Outpatient Age: 54 Room: [facility] ENDO ROOM 4 Gender: Male Note Status: Finalized Instrument Name: Colon Scope [id number] Procedure:             Colonoscopy Indications:           Screening for colorectal malignant neoplasm Providers:             Rogelia Copping MD, MD Referring MD:          Dorette FALCON. Sowles, MD (Referring MD) Medicines:             Propofol  per Anesthesia Complications:         No immediate complications. Procedure:             Pre-Anesthesia Assessment:                        - Prior to the procedure, a History and Physical was                         performed, and patient medications and allergies were                         reviewed. The patient's tolerance of previous                         anesthesia was also reviewed. The risks and benefits                         of the procedure and the sedation options and risks                         were discussed with the patient. All questions were                         answered, and informed consent was obtained. Prior                         Anticoagulants: The patient has taken no anticoagulant                         or antiplatelet agents. ASA Grade Assessment: II - A                         patient with mild systemic disease. After reviewing                         the risks and benefits, the patient was deemed in                         satisfactory condition to undergo the procedure.                        After obtaining informed consent, the colonoscope was                         passed under direct vision. Throughout the procedure,  the patient's blood pressure, pulse, and oxygen                         saturations were monitored continuously. The was                         introduced through the  anus and advanced to the the                         cecum, identified by appendiceal orifice and ileocecal                         valve. The colonoscopy was performed without                         difficulty. The patient tolerated the procedure well.                         The quality of the bowel preparation was excellent. Findings:      The perianal and digital rectal examinations were normal.      The colon (entire examined portion) appeared normal. Impression:            - The entire examined colon is normal.                        - No specimens collected. Recommendation:        - Discharge patient to home.                        - Resume previous diet.                        - Continue present medications.                        - Repeat colonoscopy in 10 years for screening                         purposes. Procedure Code(s):     --- Professional ---                        [phone], Colonoscopy, flexible; diagnostic, including                         collection of specimen(s) by brushing or washing, when                         performed (separate procedure) Diagnosis Code(s):     --- Professional ---                        Z12.11, Encounter for screening for malignant neoplasm                         of colon CPT copyright 2022 American Medical Association. All rights reserved. The codes documented in this report are preliminary and upon coder review may  be revised to meet current compliance requirements. Rogelia Copping MD, MD 01/31/2024 11:31:21 AM This report has been signed electronically. Number of Addenda: 0 Note Initiated  On: 01/31/2024 11:04 AM Scope Withdrawal Time: 0 hours 6 minutes 56 seconds  Total Procedure Duration: 0 hours 10 minutes 9 seconds  Estimated Blood Loss:  Estimated blood loss: none.      [facility]

## 2024-07-24 ENCOUNTER — Ambulatory Visit: Admitting: Family Medicine
